# Patient Record
Sex: Female | Born: 1985 | Race: Black or African American | Hispanic: No | Marital: Single | Smoking: Former smoker
Health system: Southern US, Community
[De-identification: ages and names within clinical notes are randomized; demographics above are authoritative.]

## PROBLEM LIST (undated history)

## (undated) ENCOUNTER — Inpatient Hospital Stay (HOSPITAL_COMMUNITY): Payer: Self-pay

## (undated) DIAGNOSIS — Z789 Other specified health status: Secondary | ICD-10-CM

## (undated) HISTORY — PX: NO PAST SURGERIES: SHX2092

---

## 2007-11-16 ENCOUNTER — Emergency Department (HOSPITAL_COMMUNITY): Admission: EM | Admit: 2007-11-16 | Discharge: 2007-11-16 | Payer: Self-pay | Admitting: Emergency Medicine

## 2008-12-29 ENCOUNTER — Emergency Department (HOSPITAL_BASED_OUTPATIENT_CLINIC_OR_DEPARTMENT_OTHER): Admission: EM | Admit: 2008-12-29 | Discharge: 2008-12-29 | Payer: Self-pay | Admitting: Emergency Medicine

## 2009-07-23 ENCOUNTER — Emergency Department (HOSPITAL_BASED_OUTPATIENT_CLINIC_OR_DEPARTMENT_OTHER): Admission: EM | Admit: 2009-07-23 | Discharge: 2009-07-23 | Payer: Self-pay | Admitting: Emergency Medicine

## 2009-07-24 ENCOUNTER — Emergency Department (HOSPITAL_BASED_OUTPATIENT_CLINIC_OR_DEPARTMENT_OTHER): Admission: EM | Admit: 2009-07-24 | Discharge: 2009-07-24 | Payer: Self-pay | Admitting: Emergency Medicine

## 2009-07-27 ENCOUNTER — Ambulatory Visit: Payer: Self-pay | Admitting: Radiology

## 2009-07-27 ENCOUNTER — Ambulatory Visit (HOSPITAL_BASED_OUTPATIENT_CLINIC_OR_DEPARTMENT_OTHER): Admission: RE | Admit: 2009-07-27 | Discharge: 2009-07-27 | Payer: Self-pay | Admitting: Emergency Medicine

## 2010-02-11 ENCOUNTER — Emergency Department (HOSPITAL_BASED_OUTPATIENT_CLINIC_OR_DEPARTMENT_OTHER)
Admission: EM | Admit: 2010-02-11 | Discharge: 2010-02-11 | Payer: Self-pay | Source: Home / Self Care | Admitting: Emergency Medicine

## 2010-09-26 LAB — CBC
Hemoglobin: 11.9 g/dL — ABNORMAL LOW (ref 12.0–15.0)
MCHC: 32.3 g/dL (ref 30.0–36.0)
MCV: 79.1 fL (ref 78.0–100.0)
Platelets: 260 10*3/uL (ref 150–400)
RBC: 4.64 MIL/uL (ref 3.87–5.11)

## 2010-09-26 LAB — URINE MICROSCOPIC-ADD ON

## 2010-09-26 LAB — HEPATIC FUNCTION PANEL
Bilirubin, Direct: 0 mg/dL (ref 0.0–0.3)
Indirect Bilirubin: 0.4 mg/dL (ref 0.3–0.9)

## 2010-09-26 LAB — DIFFERENTIAL
Basophils Absolute: 0 10*3/uL (ref 0.0–0.1)
Basophils Relative: 1 % (ref 0–1)
Eosinophils Relative: 2 % (ref 0–5)
Lymphs Abs: 1.7 10*3/uL (ref 0.7–4.0)
Neutrophils Relative %: 34 % — ABNORMAL LOW (ref 43–77)

## 2010-09-26 LAB — BASIC METABOLIC PANEL
BUN: 8 mg/dL (ref 6–23)
CO2: 30 mEq/L (ref 19–32)
Potassium: 3.8 mEq/L (ref 3.5–5.1)
Sodium: 142 mEq/L (ref 135–145)

## 2010-09-26 LAB — WET PREP, GENITAL
Trich, Wet Prep: NONE SEEN
Yeast Wet Prep HPF POC: NONE SEEN

## 2010-09-26 LAB — URINALYSIS, ROUTINE W REFLEX MICROSCOPIC
Leukocytes, UA: NEGATIVE
Nitrite: NEGATIVE
Specific Gravity, Urine: 1.025 (ref 1.005–1.030)
Urobilinogen, UA: 1 mg/dL (ref 0.0–1.0)
pH: 8.5 — ABNORMAL HIGH (ref 5.0–8.0)

## 2010-09-26 LAB — LIPASE, BLOOD: Lipase: 47 U/L (ref 23–300)

## 2010-10-12 ENCOUNTER — Emergency Department (HOSPITAL_BASED_OUTPATIENT_CLINIC_OR_DEPARTMENT_OTHER)
Admission: EM | Admit: 2010-10-12 | Discharge: 2010-10-12 | Disposition: A | Discharge status: Home / Self Care | Payer: BC Managed Care – PPO | Attending: Emergency Medicine | Admitting: Emergency Medicine

## 2010-10-12 ENCOUNTER — Emergency Department (INDEPENDENT_AMBULATORY_CARE_PROVIDER_SITE_OTHER): Payer: BC Managed Care – PPO

## 2010-10-12 DIAGNOSIS — R079 Chest pain, unspecified: Secondary | ICD-10-CM

## 2010-10-12 DIAGNOSIS — F17200 Nicotine dependence, unspecified, uncomplicated: Secondary | ICD-10-CM

## 2010-10-12 DIAGNOSIS — K920 Hematemesis: Secondary | ICD-10-CM

## 2010-10-12 DIAGNOSIS — R112 Nausea with vomiting, unspecified: Secondary | ICD-10-CM | POA: Insufficient documentation

## 2010-10-12 LAB — DIFFERENTIAL
Lymphocytes Relative: 55 % — ABNORMAL HIGH (ref 12–46)
Monocytes Absolute: 0.4 10*3/uL (ref 0.1–1.0)
Monocytes Relative: 8 % (ref 3–12)
Neutro Abs: 1.9 10*3/uL (ref 1.7–7.7)
Neutrophils Relative %: 36 % — ABNORMAL LOW (ref 43–77)

## 2010-10-12 LAB — URINALYSIS, ROUTINE W REFLEX MICROSCOPIC
Glucose, UA: NEGATIVE mg/dL
Hgb urine dipstick: NEGATIVE
Ketones, ur: NEGATIVE mg/dL
Protein, ur: NEGATIVE mg/dL
Specific Gravity, Urine: 1.018 (ref 1.005–1.030)
pH: 8 (ref 5.0–8.0)

## 2010-10-12 LAB — CBC
MCH: 24.2 pg — ABNORMAL LOW (ref 26.0–34.0)
MCHC: 33.7 g/dL (ref 30.0–36.0)
RDW: 12 % (ref 11.5–15.5)
WBC: 5.2 10*3/uL (ref 4.0–10.5)

## 2010-10-12 LAB — POCT CARDIAC MARKERS: CKMB, poc: <1 ng/mL — ABNORMAL LOW (ref 1.0–8.0)

## 2010-10-12 LAB — COMPREHENSIVE METABOLIC PANEL
ALT: 8 U/L (ref 0–35)
BUN: 10 mg/dL (ref 6–23)
Calcium: 9.1 mg/dL (ref 8.4–10.5)
Glucose, Bld: 72 mg/dL (ref 70–99)
Total Protein: 7.7 g/dL (ref 6.0–8.3)

## 2011-01-24 ENCOUNTER — Inpatient Hospital Stay (HOSPITAL_COMMUNITY)
Admission: AD | Admit: 2011-01-24 | Discharge: 2011-01-24 | Disposition: A | Discharge status: Home / Self Care | Payer: BC Managed Care – PPO | Source: Ambulatory Visit | Attending: Obstetrics & Gynecology | Admitting: Obstetrics & Gynecology

## 2011-01-24 ENCOUNTER — Encounter (HOSPITAL_COMMUNITY): Payer: Self-pay

## 2011-01-24 DIAGNOSIS — B9789 Other viral agents as the cause of diseases classified elsewhere: Secondary | ICD-10-CM | POA: Insufficient documentation

## 2011-01-24 DIAGNOSIS — B349 Viral infection, unspecified: Secondary | ICD-10-CM

## 2011-01-24 DIAGNOSIS — R110 Nausea: Secondary | ICD-10-CM | POA: Insufficient documentation

## 2011-01-24 HISTORY — DX: Other specified health status: Z78.9

## 2011-01-24 LAB — URINALYSIS, ROUTINE W REFLEX MICROSCOPIC
Bilirubin Urine: NEGATIVE
Glucose, UA: NEGATIVE mg/dL
Ketones, ur: NEGATIVE mg/dL
Protein, ur: NEGATIVE mg/dL
pH: 7.5 (ref 5.0–8.0)

## 2011-01-24 LAB — POCT PREGNANCY, URINE: Preg Test, Ur: NEGATIVE

## 2011-01-24 MED ORDER — PROMETHAZINE HCL 25 MG PO TABS: 25.0000 mg | ORAL_TABLET | Freq: Four times a day (QID) | ORAL | Status: AC | PRN

## 2011-01-24 MED ORDER — PROMETHAZINE HCL 25 MG PO TABS: 25.0000 mg | ORAL_TABLET | Freq: Four times a day (QID) | ORAL | Status: DC | PRN

## 2011-01-24 NOTE — Progress Notes (Signed)
Pt states exposed to "hand foot mouth" disease by godchild last Tuesday. Noticed Sores on bilateral hands & feet on Saturday. States sores are painful & feel "tingly". Had fever Wednesday night. N/v since Wednesday, states hasn't been able to eat since Wednesday.

## 2011-01-24 NOTE — Progress Notes (Signed)
Pt states that she was exposed to hand, foot and mouth disease last week. Ran a fever last Wednesday,, and has now become nauseated, unable to eat and has small blisters on hands and one on her foot.

## 2011-01-24 NOTE — ED Provider Notes (Signed)
History    patient is a 25 year old black female who presents today complaining of nausea and blisters on her hands and feet. She states she was exposed to hand foot and mouth disease about 2 weeks ago. She is concerned that she may be infected. She states that she has been nauseated. She also complains of upper abdominal pain and she feels like she has to vomit. She denies lower abdominal pain, vaginal discharge, vaginal bleeding, or any other symptoms at this time. She did notice 2 small red bumps on her hands and one small red bump on her left foot. She denies itching. She denies fever or ulcerations to her mouth or oral mucosa.  Chief Complaint  Patient presents with  . Nausea   HPI  OB History    Grav Para Term Preterm Abortions TAB SAB Ect Mult Living   0               Past Medical History  Diagnosis Date  . No pertinent past medical history     Past Surgical History  Procedure Date  . No past surgeries     Family History  Problem Relation Age of Onset  . Diabetes Father   . Stroke Father     History  Substance Use Topics  . Smoking status: Current Some Day Smoker -- 0.2 packs/day for 5 years  . Smokeless tobacco: Never Used  . Alcohol Use: Yes     socally     Allergies:  Allergies  Allergen Reactions  . Codeine Hives and Other (See Comments)    Face swelling    Prescriptions prior to admission  Medication Sig Dispense Refill  . DM-Doxylamine-Acetaminophen (NYQUIL COLD & FLU PO) Take 30 mLs by mouth every 4 (four) hours as needed. For cold       . Phenylephrine-Pheniramine-DM (THERAFLU COLD & COUGH PO) Take 1 packet by mouth every 4 (four) hours as needed. For cold         Review of Systems  Constitutional: Positive for malaise/fatigue. Negative for fever and chills.  Eyes: Negative for blurred vision.  Respiratory: Negative for cough, sputum production, shortness of breath and wheezing.   Cardiovascular: Negative for chest pain and palpitations.    Gastrointestinal: Positive for nausea and abdominal pain. Negative for vomiting, diarrhea and constipation.  Genitourinary: Negative for dysuria, urgency, frequency, hematuria and flank pain.  Musculoskeletal: Negative for myalgias.  Skin: Positive for rash. Negative for itching.  Neurological: Negative for dizziness and headaches.  Psychiatric/Behavioral: Negative for depression and suicidal ideas.   Physical Exam   Blood pressure 113/70, pulse 88, temperature 98.4 F (36.9 C), temperature source Oral, resp. rate 16, height 5\' 3"  (1.6 m), weight 125 lb 3.2 oz (56.79 kg), last menstrual period 01/05/2011, SpO2 98.00%.  Physical Exam  Constitutional: She is oriented to person, place, and time. She appears well-developed and well-nourished. No distress.  HENT:  Head: Normocephalic and atraumatic.  Eyes: EOM are normal. Pupils are equal, round, and reactive to light.  Cardiovascular: Normal rate and regular rhythm.  Exam reveals no gallop and no friction rub.   No murmur heard. Respiratory: Effort normal and breath sounds normal. No respiratory distress. She has no wheezes. She has no rales. She exhibits no tenderness.  GI: Soft. She exhibits no distension. There is no tenderness. There is no rebound and no guarding.  Neurological: She is alert and oriented to person, place, and time.  Skin: Skin is warm and dry. She is not diaphoretic.  There were 2 small erythematous lesions on the left hand. There is also one small erythematous lesion to the left foot. No ulceration was noted. No tracking was noted.    MAU Course  Procedures  Assessment and plan: 1) viral syndrome: Patient most likely is experiencing a viral syndrome of some sort. It is unlikely that she is having hand foot and mouth disease is a factor she has no oral involvement. At this time I will give her a prescription for Phenergan to use when necessary. She will followup with her primary care provider. I did discuss with  her appropriate diet, activity, wrist, and precautions. She understood and agreed.   Henrietta Hoover, [place] 01/24/11 1021

## 2011-05-29 ENCOUNTER — Emergency Department (HOSPITAL_COMMUNITY)
Admission: EM | Admit: 2011-05-29 | Discharge: 2011-05-29 | Disposition: A | Discharge status: Home / Self Care | Payer: BC Managed Care – PPO | Attending: Emergency Medicine | Admitting: Emergency Medicine

## 2011-05-29 ENCOUNTER — Emergency Department (HOSPITAL_COMMUNITY)
Admission: EM | Admit: 2011-05-29 | Discharge: 2011-05-29 | Disposition: A | Discharge status: Home / Self Care | Payer: BC Managed Care – PPO | Source: Home / Self Care

## 2011-05-29 ENCOUNTER — Encounter (HOSPITAL_COMMUNITY): Payer: Self-pay | Admitting: *Deleted

## 2011-05-29 DIAGNOSIS — E876 Hypokalemia: Secondary | ICD-10-CM | POA: Insufficient documentation

## 2011-05-29 DIAGNOSIS — R1084 Generalized abdominal pain: Secondary | ICD-10-CM | POA: Insufficient documentation

## 2011-05-29 DIAGNOSIS — D649 Anemia, unspecified: Secondary | ICD-10-CM | POA: Insufficient documentation

## 2011-05-29 DIAGNOSIS — R630 Anorexia: Secondary | ICD-10-CM | POA: Insufficient documentation

## 2011-05-29 DIAGNOSIS — R112 Nausea with vomiting, unspecified: Secondary | ICD-10-CM | POA: Insufficient documentation

## 2011-05-29 LAB — BASIC METABOLIC PANEL
BUN: 8 mg/dL (ref 6–23)
CO2: 21 mEq/L (ref 19–32)
Calcium: 9.6 mg/dL (ref 8.4–10.5)
Chloride: 103 mEq/L (ref 96–112)
Creatinine, Ser: 0.67 mg/dL (ref 0.50–1.10)
Glucose, Bld: 100 mg/dL — ABNORMAL HIGH (ref 70–99)

## 2011-05-29 LAB — URINALYSIS, ROUTINE W REFLEX MICROSCOPIC
Bilirubin Urine: NEGATIVE
Hgb urine dipstick: NEGATIVE
Ketones, ur: NEGATIVE mg/dL
Specific Gravity, Urine: 1.026 (ref 1.005–1.030)
pH: 8 (ref 5.0–8.0)

## 2011-05-29 LAB — URINE MICROSCOPIC-ADD ON

## 2011-05-29 LAB — CBC
HCT: 34.7 % — ABNORMAL LOW (ref 36.0–46.0)
MCH: 23 pg — ABNORMAL LOW (ref 26.0–34.0)
MCV: 74 fL — ABNORMAL LOW (ref 78.0–100.0)
RBC: 4.69 MIL/uL (ref 3.87–5.11)
WBC: 5.6 10*3/uL (ref 4.0–10.5)

## 2011-05-29 MED ORDER — SODIUM CHLORIDE 0.9 % IV BOLUS (SEPSIS): 1000.0000 mL | Freq: Once | INTRAVENOUS | Status: DC

## 2011-05-29 MED ORDER — MORPHINE SULFATE 4 MG/ML IJ SOLN
4.0000 mg | Freq: Once | INTRAMUSCULAR | Status: AC
  Administered 2011-05-29: 4 mg via INTRAVENOUS
  Filled 2011-05-29: qty 1

## 2011-05-29 MED ORDER — ONDANSETRON HCL 4 MG/2ML IJ SOLN
4.0000 mg | Freq: Once | INTRAMUSCULAR | Status: AC
  Administered 2011-05-29: 4 mg via INTRAVENOUS
  Filled 2011-05-29: qty 2

## 2011-05-29 MED ORDER — POTASSIUM CHLORIDE CRYS ER 20 MEQ PO TBCR
EXTENDED_RELEASE_TABLET | ORAL | Status: AC
  Administered 2011-05-29: 22:00:00
  Filled 2011-05-29: qty 2

## 2011-05-29 MED ORDER — SODIUM CHLORIDE 0.9 % IV BOLUS (SEPSIS)
1000.0000 mL | Freq: Once | INTRAVENOUS | Status: AC
  Administered 2011-05-29: 1000 mL via INTRAVENOUS

## 2011-05-29 MED ORDER — POTASSIUM CHLORIDE 20 MEQ/15ML (10%) PO LIQD: 40.0000 meq | Freq: Once | ORAL | Status: DC

## 2011-05-29 MED ORDER — ONDANSETRON HCL 4 MG/2ML IJ SOLN: 4.0000 mg | Freq: Four times a day (QID) | INTRAMUSCULAR | Status: DC | PRN

## 2011-05-29 MED ORDER — SODIUM CHLORIDE 0.9 % IV SOLN
Freq: Once | INTRAVENOUS | Status: AC
  Administered 2011-05-29: 19:00:00 via INTRAVENOUS

## 2011-05-29 MED ORDER — ONDANSETRON HCL 4 MG PO TABS: 4.0000 mg | ORAL_TABLET | Freq: Four times a day (QID) | ORAL | Status: AC

## 2011-05-29 NOTE — ED Notes (Signed)
Patient was at [facility] and left to come to [facility].  She stated that, " I didn't want to wait three hours to be seen so I left."

## 2011-05-29 NOTE — ED Notes (Signed)
Patient is resting comfortably. 

## 2011-05-29 NOTE — ED Notes (Signed)
Pt NAD, AOx4, resp e/u. Pt states understanding of discharge instructions and denies questions at this time. Pt ambulatory with steady gait.

## 2011-05-29 NOTE — ED Provider Notes (Signed)
Patient is in CDU under observation, dehydration, protocol.  Patient states she is feeling much better now.  Denies current nausea.  Labs resulted showing mild hypokalemia and anemia.  Patient states that she has history of anemia.  Potassium was given to patient.  I discussed all results with patient.  Plan is for discharge home with Zofran.  I have advised patient to return for worsening symptoms and to use the resources for followup with her primary care provider.  Dillard Cannon [place], [place] 05/30/11 [phone]

## 2011-05-29 NOTE — ED Provider Notes (Signed)
History     CSN: [id number] Arrival date & time: 05/29/2011  4:07 PM   First MD Initiated Contact with Patient 05/29/11 1735      Chief Complaint  Patient presents with  . Emesis  . Abdominal Pain    (Consider location/radiation/quality/duration/timing/severity/associated sxs/prior treatment) The history is provided by the patient.   patient reports nausea vomiting and generalized abdominal pain since 6 AM.  She's had nonbloody nonbilious vomiting.  She denies diarrhea.  She denies dysuria and vaginal discharge.  She denies urinary frequency.  Reports his been unable to keep anything down.  She denies fever or chills.  She denies flank pain.  No chest pain shortness of breath.  Denies cough.  Her symptoms are worsened by nothing.  They're approved by nothing.  Her symptoms are constant.  Her symptoms are severe  Past Medical History  Diagnosis Date  . No pertinent past medical history     Past Surgical History  Procedure Date  . No past surgeries     Family History  Problem Relation Age of Onset  . Diabetes Father   . Stroke Father     History  Substance Use Topics  . Smoking status: Current Some Day Smoker -- 0.2 packs/day for 5 years  . Smokeless tobacco: Never Used  . Alcohol Use: Yes     socally     OB History    Grav Para Term Preterm Abortions TAB SAB Ect Mult Living   0               Review of Systems  Gastrointestinal: Positive for vomiting and abdominal pain.  All other systems reviewed and are negative.    Allergies  Codeine  Home Medications  No current outpatient prescriptions on file.  BP 100/62  Pulse 84  Temp(Src) 98.4 F (36.9 C) (Oral)  Resp 22  SpO2 100%  LMP 04/11/2011  Physical Exam  Nursing note and vitals reviewed. Constitutional: She is oriented to person, place, and time. She appears well-developed and well-nourished. No distress.  HENT:  Head: Normocephalic and atraumatic.       Dry mucous membranes  Eyes: EOM are  normal.  Neck: Normal range of motion.  Cardiovascular: Normal rate, regular rhythm and normal heart sounds.   Pulmonary/Chest: Effort normal and breath sounds normal.  Abdominal: Soft. She exhibits no distension. There is no tenderness.  Musculoskeletal: Normal range of motion.  Neurological: She is alert and oriented to person, place, and time.  Skin: Skin is warm and dry.  Psychiatric: She has a normal mood and affect. Judgment normal.    ED Course  Procedures (including critical care time)  Labs Reviewed  URINALYSIS, ROUTINE W REFLEX MICROSCOPIC - Abnormal; Notable for the following:    Protein, ur 30 (*)    All other components within normal limits  CBC - Abnormal; Notable for the following:    Hemoglobin 10.8 (*)    HCT 34.7 (*)    MCV 74.0 (*)    MCH 23.0 (*)    All other components within normal limits  BASIC METABOLIC PANEL - Abnormal; Notable for the following:    Potassium 3.1 (*)    Glucose, Bld 100 (*)    All other components within normal limits  URINE MICROSCOPIC-ADD ON - Abnormal; Notable for the following:    Squamous Epithelial / LPF FEW (*)    Bacteria, UA FEW (*)    All other components within normal limits  PREGNANCY, URINE  No results found.   1. Nausea and vomiting   2. Hypokalemia   3. Anemia       MDM  Nausea vomiting without diarrhea.  Her abdominal exam is benign.  Suspect viral etiology.  Still awaiting urine and urine pregnancy at this time.  My suspicion for ectopic his lobe however will require a negative urine pregnancy test before she is discharged home.  She feels much better after IV fluids and antibiotics.  She is currently on the dehydration protocol while we evaluate her in the emergency department.  Care will be be continued by my physician assistant Ms. Trixie Dredge        Lyanne Co, MD 05/29/11 2255

## 2011-05-29 NOTE — ED Notes (Signed)
Patient denies pain and is resting comfortably.  

## 2011-05-29 NOTE — ED Notes (Signed)
Reports n/v and abd pain since this am 0600. Denies diarrhea.

## 2011-05-29 NOTE — ED Notes (Signed)
Pt not able to urinate yet. Pt has received l liter bolus of saline iv.

## 2011-05-29 NOTE — ED Notes (Signed)
States going out and having etoh drinks last pm. States awakening at 6am with sx. Pt also states left quadrant abd pain.

## 2011-05-30 NOTE — ED Provider Notes (Addendum)
Medical screening examination/treatment/procedure(s) were conducted as a shared visit with non-physician practitioner(s) and myself.  I personally evaluated the patient during the encounter  I was the primary provider of this patient during this ER visit. The patients care was continued in the CDU and managed in conjunction with my midlevel providers   1. Nausea and vomiting   2. Hypokalemia   3. Anemia      Results for orders placed during the hospital encounter of 05/29/11  URINALYSIS, ROUTINE W REFLEX MICROSCOPIC      Component Value Range   Color, Urine YELLOW  YELLOW    Appearance CLEAR  CLEAR    Specific Gravity, Urine 1.026  1.005 - 1.030    pH 8.0  5.0 - 8.0    Glucose, UA NEGATIVE  NEGATIVE (mg/dL)   Hgb urine dipstick NEGATIVE  NEGATIVE    Bilirubin Urine NEGATIVE  NEGATIVE    Ketones, ur NEGATIVE  NEGATIVE (mg/dL)   Protein, ur 30 (*) NEGATIVE (mg/dL)   Urobilinogen, UA 0.2  0.0 - 1.0 (mg/dL)   Nitrite NEGATIVE  NEGATIVE    Leukocytes, UA NEGATIVE  NEGATIVE   PREGNANCY, URINE      Component Value Range   Preg Test, Ur NEGATIVE    CBC      Component Value Range   WBC 5.6  4.0 - 10.5 (K/uL)   RBC 4.69  3.87 - 5.11 (MIL/uL)   Hemoglobin 10.8 (*) 12.0 - 15.0 (g/dL)   HCT 16.1 (*) 09.6 - 46.0 (%)   MCV 74.0 (*) 78.0 - 100.0 (fL)   MCH 23.0 (*) 26.0 - 34.0 (pg)   MCHC 31.1  30.0 - 36.0 (g/dL)   RDW 04.5  40.9 - 81.1 (%)   Platelets 304  150 - 400 (K/uL)  BASIC METABOLIC PANEL      Component Value Range   Sodium 140  135 - 145 (mEq/L)   Potassium 3.1 (*) 3.5 - 5.1 (mEq/L)   Chloride 103  96 - 112 (mEq/L)   CO2 21  19 - 32 (mEq/L)   Glucose, Bld 100 (*) 70 - 99 (mg/dL)   BUN 8  6 - 23 (mg/dL)   Creatinine, Ser 9.14  0.50 - 1.10 (mg/dL)   Calcium 9.6  8.4 - 78.2 (mg/dL)   GFR calc non Af Amer >90  >90 (mL/min)   GFR calc Af Amer >90  >90 (mL/min)  URINE MICROSCOPIC-ADD ON      Component Value Range   Squamous Epithelial / LPF FEW (*) RARE    WBC, UA 0-2  <3  (WBC/hpf)   RBC / HPF 0-2  <3 (RBC/hpf)   Bacteria, UA FEW (*) RARE    Urine-Other MUCOUS PRESENT      Lyanne Co, MD 05/30/11 9562  Lyanne Co, MD 05/30/11 [phone]

## 2016-03-13 ENCOUNTER — Encounter (HOSPITAL_BASED_OUTPATIENT_CLINIC_OR_DEPARTMENT_OTHER): Payer: Self-pay | Admitting: Emergency Medicine

## 2016-03-13 ENCOUNTER — Inpatient Hospital Stay (HOSPITAL_BASED_OUTPATIENT_CLINIC_OR_DEPARTMENT_OTHER)
Admission: EM | Admit: 2016-03-13 | Discharge: 2016-03-13 | Disposition: A | Discharge status: Home / Self Care | Payer: Self-pay | Attending: Obstetrics & Gynecology | Admitting: Obstetrics & Gynecology

## 2016-03-13 ENCOUNTER — Emergency Department (HOSPITAL_BASED_OUTPATIENT_CLINIC_OR_DEPARTMENT_OTHER): Payer: Self-pay

## 2016-03-13 DIAGNOSIS — O9989 Other specified diseases and conditions complicating pregnancy, childbirth and the puerperium: Secondary | ICD-10-CM | POA: Insufficient documentation

## 2016-03-13 DIAGNOSIS — O4700 False labor before 37 completed weeks of gestation, unspecified trimester: Secondary | ICD-10-CM

## 2016-03-13 DIAGNOSIS — O4703 False labor before 37 completed weeks of gestation, third trimester: Secondary | ICD-10-CM | POA: Insufficient documentation

## 2016-03-13 DIAGNOSIS — O479 False labor, unspecified: Secondary | ICD-10-CM

## 2016-03-13 DIAGNOSIS — M7989 Other specified soft tissue disorders: Secondary | ICD-10-CM | POA: Insufficient documentation

## 2016-03-13 DIAGNOSIS — Z3A32 32 weeks gestation of pregnancy: Secondary | ICD-10-CM | POA: Insufficient documentation

## 2016-03-13 DIAGNOSIS — O99013 Anemia complicating pregnancy, third trimester: Secondary | ICD-10-CM | POA: Insufficient documentation

## 2016-03-13 DIAGNOSIS — Z87891 Personal history of nicotine dependence: Secondary | ICD-10-CM | POA: Insufficient documentation

## 2016-03-13 HISTORY — DX: Other specified health status: Z78.9

## 2016-03-13 LAB — CBC WITH DIFFERENTIAL/PLATELET
BASOS PCT: 0 %
Basophils Absolute: 0 10*3/uL (ref 0.0–0.1)
EOS ABS: 0.1 10*3/uL (ref 0.0–0.7)
Eosinophils Relative: 1 %
HCT: 29.6 % — ABNORMAL LOW (ref 36.0–46.0)
Hemoglobin: 9.8 g/dL — ABNORMAL LOW (ref 12.0–15.0)
Lymphocytes Relative: 29 %
Lymphs Abs: 2 10*3/uL (ref 0.7–4.0)
MCH: 26.1 pg (ref 26.0–34.0)
MCHC: 33.1 g/dL (ref 30.0–36.0)
MCV: 78.7 fL (ref 78.0–100.0)
MONO ABS: 0.8 10*3/uL (ref 0.1–1.0)
MONOS PCT: 11 %
Neutro Abs: 4 10*3/uL (ref 1.7–7.7)
Neutrophils Relative %: 59 %
PLATELETS: 306 10*3/uL (ref 150–400)
RBC: 3.76 MIL/uL — ABNORMAL LOW (ref 3.87–5.11)
RDW: 13.4 % (ref 11.5–15.5)
WBC: 6.8 10*3/uL (ref 4.0–10.5)

## 2016-03-13 LAB — URINALYSIS, ROUTINE W REFLEX MICROSCOPIC
BILIRUBIN URINE: NEGATIVE
Glucose, UA: NEGATIVE mg/dL
Hgb urine dipstick: NEGATIVE
KETONES UR: NEGATIVE mg/dL
Leukocytes, UA: NEGATIVE
NITRITE: NEGATIVE
PH: 8 (ref 5.0–8.0)
PROTEIN: NEGATIVE mg/dL
Specific Gravity, Urine: 1.014 (ref 1.005–1.030)

## 2016-03-13 LAB — COMPREHENSIVE METABOLIC PANEL
ALBUMIN: 3.1 g/dL — AB (ref 3.5–5.0)
ALK PHOS: 70 U/L (ref 38–126)
ALT: 20 U/L (ref 14–54)
AST: 32 U/L (ref 15–41)
Anion gap: 8 (ref 5–15)
BILIRUBIN TOTAL: 0.4 mg/dL (ref 0.3–1.2)
BUN: <5 mg/dL — ABNORMAL LOW (ref 6–20)
CALCIUM: 8.7 mg/dL — AB (ref 8.9–10.3)
CO2: 22 mmol/L (ref 22–32)
CREATININE: 0.55 mg/dL (ref 0.44–1.00)
Chloride: 104 mmol/L (ref 101–111)
GFR calc non Af Amer: >60 mL/min (ref >60)
GLUCOSE: 78 mg/dL (ref 65–99)
Potassium: 3.5 mmol/L (ref 3.5–5.1)
SODIUM: 134 mmol/L — AB (ref 135–145)
TOTAL PROTEIN: 6.2 g/dL — AB (ref 6.5–8.1)

## 2016-03-13 LAB — BRAIN NATRIURETIC PEPTIDE: B NATRIURETIC PEPTIDE 5: 23 pg/mL (ref 0.0–100.0)

## 2016-03-13 MED ORDER — LACTATED RINGERS IV BOLUS (SEPSIS)
1000.0000 mL | Freq: Once | INTRAVENOUS | Status: AC
  Administered 2016-03-13: 1000 mL via INTRAVENOUS

## 2016-03-13 NOTE — MAU Provider Note (Signed)
History   [id number]   Chief Complaint  Patient presents with  . Leg Swelling    pregnant [redacted] weeks    HPI Amber Thornton is a 30 y.o. female  G1P0 at [redacted]w[redacted]d IUP here to rule-out preterm labor.  Pt previously seen at [facility] for leg swelling and DVT rule-out; imaging negative.  Sent to [facility] due to contractions showing on the monitor.  Pt denies feeling contractions.  No report of vaginal bleeding, leaking of fluid, or UTI symptoms.  +fetal movement.  Pt denies problems during this pregnancy.  Receives prenatal care in [place].     No LMP recorded (exact date). Patient is pregnant.  OB History  Gravida Para Term Preterm AB Living  1            SAB TAB Ectopic Multiple Live Births               # Outcome Date GA Lbr Len/2nd Weight Sex Delivery Anes PTL Lv  1 Current               Past Medical History:  Diagnosis Date  . Medical history non-contributory   . No pertinent past medical history     Family History  Problem Relation Age of Onset  . Diabetes Father   . Stroke Father     Social History   Social History  . Marital status: Single    Spouse name: N/A  . Number of children: N/A  . Years of education: N/A   Social History Main Topics  . Smoking status: Former Smoker    Years: 0.00  . Smokeless tobacco: Never Used  . Alcohol use No  . Drug use: No  . Sexual activity: No   Other Topics Concern  . None   Social History Narrative  . None    Allergies  Allergen Reactions  . Codeine Hives and Other (See Comments)    Face swelling    No current facility-administered medications on file prior to encounter.    No current outpatient prescriptions on file prior to encounter.     Review of Systems  Pertinent information in HPI  Physical Exam   Vitals:   03/13/16 1755 03/13/16 1827 03/13/16 1851 03/13/16 1953  BP: 105/59 122/68 125/68 108/66  Pulse: 88 88 88 85  Resp: 18 18 18 20   Temp:  98.1 F (36.7 C)  97.6 F (36.4 C)   TempSrc:    Oral  SpO2: 100% 100% 100% 100%  Weight:      Height:        Physical Exam  Constitutional: She is oriented to person, place, and time. She appears well-developed and well-nourished.  HENT:  Head: Normocephalic.  Neck: Normal range of motion. Neck supple.  Cardiovascular: Normal rate, regular rhythm and normal heart sounds.   Respiratory: Effort normal and breath sounds normal. No respiratory distress.  GI: Soft. There is no tenderness.  Genitourinary: No bleeding in the vagina. Vaginal discharge (mucusy) found.  Musculoskeletal: Normal range of motion. She exhibits no edema.  Neurological: She is alert and oriented to person, place, and time.  Skin: Skin is warm and dry.   Dilation: Closed Effacement (%): Thick Exam by:: Margarita Mail CNM  FHR 130's, +accels Toco - irregular (not felt by patient)  MAU Course  Procedures Results for orders placed or performed during the hospital encounter of 03/13/16 (from the past 24 hour(s))  Comprehensive metabolic panel     Status: Abnormal  Collection Time: 03/13/16  4:45 PM  Result Value Ref Range   Sodium 134 (L) 135 - 145 mmol/L   Potassium 3.5 3.5 - 5.1 mmol/L   Chloride 104 101 - 111 mmol/L   CO2 22 22 - 32 mmol/L   Glucose, Bld 78 65 - 99 mg/dL   BUN <5 (L) 6 - 20 mg/dL   Creatinine, Ser 9.620.55 0.44 - 1.00 mg/dL   Calcium 8.7 (L) 8.9 - 10.3 mg/dL   Total Protein 6.2 (L) 6.5 - 8.1 g/dL   Albumin 3.1 (L) 3.5 - 5.0 g/dL   AST 32 15 - 41 U/L   ALT 20 14 - 54 U/L   Alkaline Phosphatase 70 38 - 126 U/L   Total Bilirubin 0.4 0.3 - 1.2 mg/dL   GFR calc non Af Amer >60 >60 mL/min   GFR calc Af Amer >60 >60 mL/min   Anion gap 8 5 - 15  CBC with Differential     Status: Abnormal   Collection Time: 03/13/16  4:45 PM  Result Value Ref Range   WBC 6.8 4.0 - 10.5 K/uL   RBC 3.76 (L) 3.87 - 5.11 MIL/uL   Hemoglobin 9.8 (L) 12.0 - 15.0 g/dL   HCT 95.229.6 (L) 84.136.0 - 32.446.0 %   MCV 78.7 78.0 - 100.0 fL   MCH 26.1 26.0 - 34.0 pg    MCHC 33.1 30.0 - 36.0 g/dL   RDW 40.113.4 02.711.5 - 25.315.5 %   Platelets 306 150 - 400 K/uL   Neutrophils Relative % 59 %   Neutro Abs 4.0 1.7 - 7.7 K/uL   Lymphocytes Relative 29 %   Lymphs Abs 2.0 0.7 - 4.0 K/uL   Monocytes Relative 11 %   Monocytes Absolute 0.8 0.1 - 1.0 K/uL   Eosinophils Relative 1 %   Eosinophils Absolute 0.1 0.0 - 0.7 K/uL   Basophils Relative 0 %   Basophils Absolute 0.0 0.0 - 0.1 K/uL  Brain natriuretic peptide     Status: None   Collection Time: 03/13/16  4:45 PM  Result Value Ref Range   B Natriuretic Peptide 23.0 0.0 - 100.0 pg/mL  Urinalysis, Routine w reflex microscopic     Status: None   Collection Time: 03/13/16  5:15 PM  Result Value Ref Range   Color, Urine YELLOW YELLOW   APPearance CLEAR CLEAR   Specific Gravity, Urine 1.014 1.005 - 1.030   pH 8.0 5.0 - 8.0   Glucose, UA NEGATIVE NEGATIVE mg/dL   Hgb urine dipstick NEGATIVE NEGATIVE   Bilirubin Urine NEGATIVE NEGATIVE   Ketones, ur NEGATIVE NEGATIVE mg/dL   Protein, ur NEGATIVE NEGATIVE mg/dL   Nitrite NEGATIVE NEGATIVE   Leukocytes, UA NEGATIVE NEGATIVE     Assessment and Plan  30 y.o. G1P0 at 1662w2d IUP  Braxton Hick's - Normal Exam Reactive NST  Plan: Discharge home Reviewed kick counts Keep prenatal care appointment  Marlis EdelsonWalidah N Karim, CNM 03/13/2016 8:30 PM

## 2016-03-13 NOTE — Progress Notes (Signed)
Report given to Laurell Josephsheryl  Anderson RN.

## 2016-03-13 NOTE — ED Notes (Signed)
Report given to [facility]

## 2016-03-13 NOTE — ED Provider Notes (Signed)
[facility] Provider Note   CSN: [id number] Arrival date & time: 03/13/16  1549 By signing my name below, I, Bridgette Habermann, attest that this documentation has been prepared under the direction and in the presence of Pricilla Loveless, MD. Electronically Signed: Bridgette Habermann, ED Scribe. 03/13/16. 4:38 PM.  History   Chief Complaint Chief Complaint  Patient presents with  . Leg Swelling    pregnant [redacted] weeks   HPI Comments: Amber Thornton is a 30 y.o. female who is [redacted] weeks pregnant who presents to the Emergency Department complaining of gradual onset, constant, 7/10, aching left calf pain onset this morning. Pt has associated shortness of breath when she stands up. Pt notes she has been having bilateral lower extremity swelling (L>R) beginning a month ago. No alleviating factors noted. She states that she has been traveling back and forth to [place], the last time she flew was 2 days ago. Denies fever, cough, chest pain, or any other associated symptoms.  The history is provided by the patient. No language interpreter was used.    Past Medical History:  Diagnosis Date  . No pertinent past medical history     Patient Active Problem List   Diagnosis Date Noted  . Anemia 05/29/2011    Past Surgical History:  Procedure Laterality Date  . NO PAST SURGERIES      OB History    Gravida Para Term Preterm AB Living   1             SAB TAB Ectopic Multiple Live Births                   Home Medications    Prior to Admission medications   Not on File    Family History Family History  Problem Relation Age of Onset  . Diabetes Father   . Stroke Father     Social History Social History  Substance Use Topics  . Smoking status: Former Smoker    Years: 0.00  . Smokeless tobacco: Never Used  . Alcohol use No     Allergies   Codeine   Review of Systems Review of Systems  Constitutional: Negative for fever.  Respiratory: Positive for shortness of breath.     Cardiovascular: Positive for leg swelling. Negative for chest pain.  Musculoskeletal: Positive for arthralgias.  All other systems reviewed and are negative.    Physical Exam Updated Vital Signs BP 125/68   Pulse 88   Temp 98.1 F (36.7 C)   Resp 18   Ht 5\' 2"  (1.575 m)   Wt 156 lb (70.8 kg)   LMP  (Exact Date)   SpO2 100%   BMI 28.53 kg/m   Physical Exam  Constitutional: She is oriented to person, place, and time. She appears well-developed and well-nourished.  HENT:  Head: Normocephalic and atraumatic.  Right Ear: External ear normal.  Left Ear: External ear normal.  Nose: Nose normal.  Eyes: Right eye exhibits no discharge. Left eye exhibits no discharge.  Cardiovascular: Normal rate, regular rhythm and normal heart sounds.   Pulses:      Dorsalis pedis pulses are 2+ on the right side, and 2+ on the left side.  Pulmonary/Chest: Effort normal and breath sounds normal. She has no wheezes. She has no rales.  Abdominal: Soft. There is no tenderness.  Gravid  Musculoskeletal: She exhibits edema and tenderness.  Left leg symmetrically swollen, most prominently in the foot but also in the calf. No redness but there  is posterior calf tenderness. Mild right foot, non-pitting edema.  Neurological: She is alert and oriented to person, place, and time.  Skin: Skin is warm and dry.  Nursing note and vitals reviewed.    ED Treatments / Results  DIAGNOSTIC STUDIES: Oxygen Saturation is 100% on RA, normal by my interpretation.    COORDINATION OF CARE: 4:34 PM Discussed treatment plan with pt at bedside which includes ultrasound and pt agreed to plan.  Labs (all labs ordered are listed, but only abnormal results are displayed) Labs Reviewed  COMPREHENSIVE METABOLIC PANEL - Abnormal; Notable for the following:       Result Value   Sodium 134 (*)    BUN <5 (*)    Calcium 8.7 (*)    Total Protein 6.2 (*)    Albumin 3.1 (*)    All other components within normal limits  CBC  WITH DIFFERENTIAL/PLATELET - Abnormal; Notable for the following:    RBC 3.76 (*)    Hemoglobin 9.8 (*)    HCT 29.6 (*)    All other components within normal limits  URINALYSIS, ROUTINE W REFLEX MICROSCOPIC (NOT AT [facility])  BRAIN NATRIURETIC PEPTIDE    EKG  EKG Interpretation None       Radiology US Venous Img Lower Unilateral Left  Result Date: 03/13/2016 CLINICAL DATA:  Left lower extremity swelling and pain for 1 month. Patient is [redacted] weeks pregnant. EXAM: LEFT LOWER EXTREMITY VENOUS DOPPLER ULTRASOUND TECHNIQUE: Gray-scale sonography with graded compression, as well as color Doppler and duplex ultrasound were performed to evaluate the lower extremity deep venous systems from the level of the common femoral vein and including the common femoral, femoral, profunda femoral, popliteal and calf veins including the posterior tibial, peroneal and gastrocnemius veins when visible. The superficial great saphenous vein was also interrogated. Spectral Doppler was utilized to evaluate flow at rest and with distal augmentation maneuvers in the common femoral, femoral and popliteal veins. COMPARISON:  None. FINDINGS: Normal flow, compressibility, and augmentation within the distal common femoral, proximal profunda femoral, proximal greater saphenous, entire femoral, popliteal veins, and imaged calf veins. IMPRESSION: No evidence of left lower extremity deep venous thrombosis. Electronically Signed   By: Harmon Pier M.D.   On: 03/13/2016 17:23    Procedures Procedures (including critical care time)  Medications Ordered in ED Medications  lactated ringers bolus 1,000 mL (0 mLs Intravenous Stopped 03/13/16 1852)     Initial Impression / Assessment and Plan / ED Course  I have reviewed the triage vital signs and the nursing notes.  Pertinent labs & imaging results that were available during my care of the patient were reviewed by me and considered in my medical decision making (see chart for  details).  Clinical Course  Comment By Time  OB rapid response asks for IV fluid bolus given intermittent contractions. I am more worried about DVT than true fluid overload. Pricilla Loveless, MD 09/03 [phone]    Patient's workup including DVT ultrasound are unremarkable. However while being on the tocometry, she has been noted to have pretty consistent contractions. Patient is not feeling these. Fetal heart rate is doing well. Discussed with OB/GYN, Dr. Erin Fulling, recommends transfer to MAU for continues to, treatment and observation.  7:08 PM while waiting on [facility] to pick up patient, it is apparent that [facility] will be several hours. [facility] not comfortable with patient either. I have very low suspicion patient will actually deliver anytime soon and think these are likely not true contractions. After  discussion with patient she does not want to wait any longer on the ambulance and [facility]. She understands there is some risk of worsening baby outcome or worsening contractions or even delivery on driving. She is instructed to not eat or drink. She understands these risks and [facility].  Final Clinical Impressions(s) / ED Diagnoses   Final diagnoses:  Left leg swelling  Premature uterine contractions    New Prescriptions New Prescriptions   No medications on file   I personally performed the services described in this documentation, which was scribed in my presence. The recorded information has been reviewed and is accurate.    Pricilla LovelessScott Unika Nazareno, MD 03/13/16 1910

## 2016-03-13 NOTE — Progress Notes (Addendum)
Spoke with Dr. Erin FullingHarraway-Smith. Pt is a G1P0 at 32 2/[redacted] weeks gestation with c/o bilateral calf pain. Pt says she has been flying back and forth to [place]. Last time she flew was 2 days ago. FHR is a category 1 with ui and irregular uc's. [facility] RN has said that the pt is not complaining of any cramping or abd pain. No vaginal bleeding or leaking of fluid. Okay to give pt a liter of IVF. U/S of lower legs being done to r/o DVT.

## 2016-03-13 NOTE — ED Notes (Signed)
Pt on toco and fetal monitor, auto VS and also continuous pulse ox.

## 2016-03-13 NOTE — Progress Notes (Signed)
Spoke with Dr. Erin FullingHarraway-Smith. Pt is contracting every 4-6 min. Is receiving  IVF and has emptied her bladder. No vaginal bleeding or leaking of fluid. [facility] staff says pt denies feeling uc's. Pt [facility] for further evaluation for preterm U/C's. Labs are neg and pt has no DVT.

## 2016-03-13 NOTE — MAU Note (Signed)
Pt arrives and was [facility] and sent here via private vehicle for PTL assessment. Pt denies feeling ctxs. Pt denies LOF or vaginal bleeding. Fetus active.

## 2016-03-13 NOTE — ED Triage Notes (Signed)
Patient reports that she has been traveling back and forth to [place]. Last time she flew was 2 days ago

## 2016-03-13 NOTE — ED Notes (Signed)
[facility]

## 2016-03-13 NOTE — Progress Notes (Signed)
[facility]. Pt is a G2P0 at 32 2/[redacted] weeks gestation with c/o her feet and lower legs swelling and bilateral calf pain. Pt has been flying back and forth to [place]. Last time she flew was 2 days ago. Pt denies headache, blurred vision, spots before her eyes, or epigastric pain.Pt placed on EFM. MD has not yet seen pt.

## 2016-03-13 NOTE — ED Notes (Addendum)
MD at bedside to discuss transport delays with patient and discuss other  [facility] and states that she is able to transport herself by pov. Pt states that she [facility] and agrees with the plan.

## 2016-03-13 NOTE — ED Notes (Signed)
U/s at bedside

## 2016-03-13 NOTE — ED Notes (Signed)
MD with pt  

## 2016-03-13 NOTE — Progress Notes (Signed)
[facility] Ed notified that Dr. Erin FullingHarraway-Smith wants pt to be [facility] for further evaluation. Dr. Criss AlvineGoldston will call Dr. Erin FullingHarraway-Smith to arrange transfer.

## 2016-03-13 NOTE — Discharge Instructions (Signed)
Preterm Labor Information °Preterm labor is when labor starts at less than 37 weeks of pregnancy. The normal length of a pregnancy is 39 to 41 weeks. °CAUSES °Often, there is no identifiable underlying cause as to why a woman goes into preterm labor. One of the most common known causes of preterm labor is infection. Infections of the uterus, cervix, vagina, amniotic sac, bladder, kidney, or even the lungs (pneumonia) can cause labor to start. Other suspected causes of preterm labor include:  °· Urogenital infections, such as yeast infections and bacterial vaginosis.   °· Uterine abnormalities (uterine shape, uterine septum, fibroids, or bleeding from the placenta).   °· A cervix that has been operated on (it may fail to stay closed).   °· Malformations in the fetus.   °· Multiple gestations (twins, triplets, and so on).   °· Breakage of the amniotic sac.   °RISK FACTORS °· Having a previous history of preterm labor.   °· Having premature rupture of membranes (PROM).   °· Having a placenta that covers the opening of the cervix (placenta previa).   °· Having a placenta that separates from the uterus (placental abruption).   °· Having a cervix that is too weak to hold the fetus in the uterus (incompetent cervix).   °· Having too much fluid in the amniotic sac (polyhydramnios).   °· Taking illegal drugs or smoking while pregnant.   °· Not gaining enough weight while pregnant.   °· Being younger than 18 and older than 30 years old.   °· Having a low socioeconomic status.   °· Being African American. °SYMPTOMS °Signs and symptoms of preterm labor include:  °· Menstrual-like cramps, abdominal pain, or back pain. °· Uterine contractions that are regular, as frequent as six in an hour, regardless of their intensity (may be mild or painful). °· Contractions that start on the top of the uterus and spread down to the lower abdomen and back.   °· A sense of increased pelvic pressure.   °· A watery or bloody mucus discharge that  comes from the vagina.   °TREATMENT °Depending on the length of the pregnancy and other circumstances, your health care provider may suggest bed rest. If necessary, there are medicines that can be given to stop contractions and to mature the fetal lungs. If labor happens before 34 weeks of pregnancy, a prolonged hospital stay may be recommended. Treatment depends on the condition of both you and the fetus.  °WHAT SHOULD YOU DO IF YOU THINK YOU ARE IN PRETERM LABOR? °Call your health care provider right away. You will need to go to the hospital to get checked immediately. °HOW CAN YOU PREVENT PRETERM LABOR IN FUTURE PREGNANCIES? °You should:  °· Stop smoking if you smoke.  °· Maintain healthy weight gain and avoid chemicals and drugs that are not necessary. °· Be watchful for any type of infection. °· Inform your health care provider if you have a known history of preterm labor. °  °This information is not intended to replace advice given to you by your health care provider. Make sure you discuss any questions you have with your health care provider. °  °Document Released: 09/17/2003 Document Revised: 02/27/2013 Document Reviewed: 07/30/2012 °Elsevier Interactive Patient Education ©2016 [employer] °Fetal Movement Counts °Patient Name: __________________________________________________ Patient Due Date: ____________________ °Performing a fetal movement count is highly recommended in high-risk pregnancies, but it is good for every pregnant woman to do. Your health care provider may ask you to start counting fetal movements at 28 weeks of the pregnancy. Fetal movements often increase: °· After eating a full meal. °·   After physical activity. °· After eating or drinking something sweet or cold. °· At rest. °Pay attention to when you feel the baby is most active. This will help you notice a pattern of your baby's sleep and wake cycles and what factors contribute to an increase in fetal movement. It is important to  perform a fetal movement count at the same time each day when your baby is normally most active.  °HOW TO COUNT FETAL MOVEMENTS °1. Find a quiet and comfortable area to sit or lie down on your left side. Lying on your left side provides the best blood and oxygen circulation to your baby. °2. Write down the day and time on a sheet of paper or in a journal. °3. Start counting kicks, flutters, swishes, rolls, or jabs in a 2-hour period. You should feel at least 10 movements within 2 hours. °4. If you do not feel 10 movements in 2 hours, wait 2-3 hours and count again. Look for a change in the pattern or not enough counts in 2 hours. °SEEK MEDICAL CARE IF: °· You feel less than 10 counts in 2 hours, tried twice. °· There is no movement in over an hour. °· The pattern is changing or taking longer each day to reach 10 counts in 2 hours. °· You feel the baby is not moving as he or she usually does. °Date: ____________ Movements: ____________ Start time: ____________ Finish time: ____________  °Date: ____________ Movements: ____________ Start time: ____________ Finish time: ____________ °Date: ____________ Movements: ____________ Start time: ____________ Finish time: ____________ °Date: ____________ Movements: ____________ Start time: ____________ Finish time: ____________ °Date: ____________ Movements: ____________ Start time: ____________ Finish time: ____________ °Date: ____________ Movements: ____________ Start time: ____________ Finish time: ____________ °Date: ____________ Movements: ____________ Start time: ____________ Finish time: ____________ °Date: ____________ Movements: ____________ Start time: ____________ Finish time: ____________  °Date: ____________ Movements: ____________ Start time: ____________ Finish time: ____________ °Date: ____________ Movements: ____________ Start time: ____________ Finish time: ____________ °Date: ____________ Movements: ____________ Start time: ____________ Finish time:  ____________ °Date: ____________ Movements: ____________ Start time: ____________ Finish time: ____________ °Date: ____________ Movements: ____________ Start time: ____________ Finish time: ____________ °Date: ____________ Movements: ____________ Start time: ____________ Finish time: ____________ °Date: ____________ Movements: ____________ Start time: ____________ Finish time: ____________  °Date: ____________ Movements: ____________ Start time: ____________ Finish time: ____________ °Date: ____________ Movements: ____________ Start time: ____________ Finish time: ____________ °Date: ____________ Movements: ____________ Start time: ____________ Finish time: ____________ °Date: ____________ Movements: ____________ Start time: ____________ Finish time: ____________ °Date: ____________ Movements: ____________ Start time: ____________ Finish time: ____________ °Date: ____________ Movements: ____________ Start time: ____________ Finish time: ____________ °Date: ____________ Movements: ____________ Start time: ____________ Finish time: ____________  °Date: ____________ Movements: ____________ Start time: ____________ Finish time: ____________ °Date: ____________ Movements: ____________ Start time: ____________ Finish time: ____________ °Date: ____________ Movements: ____________ Start time: ____________ Finish time: ____________ °Date: ____________ Movements: ____________ Start time: ____________ Finish time: ____________ °Date: ____________ Movements: ____________ Start time: ____________ Finish time: ____________ °Date: ____________ Movements: ____________ Start time: ____________ Finish time: ____________ °Date: ____________ Movements: ____________ Start time: ____________ Finish time: ____________  °Date: ____________ Movements: ____________ Start time: ____________ Finish time: ____________ °Date: ____________ Movements: ____________ Start time: ____________ Finish time: ____________ °Date: ____________ Movements:  ____________ Start time: ____________ Finish time: ____________ °Date: ____________ Movements: ____________ Start time: ____________ Finish time: ____________ °Date: ____________ Movements: ____________ Start time: ____________ Finish time: ____________ °Date: ____________ Movements: ____________ Start time: ____________ Finish time: ____________ °Date: ____________ Movements: ____________ Start time: ____________   Finish time: ____________  °Date: ____________ Movements: ____________ Start time: ____________ Finish time: ____________ °Date: ____________ Movements: ____________ Start time: ____________ Finish time: ____________ °Date: ____________ Movements: ____________ Start time: ____________ Finish time: ____________ °Date: ____________ Movements: ____________ Start time: ____________ Finish time: ____________ °Date: ____________ Movements: ____________ Start time: ____________ Finish time: ____________ °Date: ____________ Movements: ____________ Start time: ____________ Finish time: ____________ °Date: ____________ Movements: ____________ Start time: ____________ Finish time: ____________  °Date: ____________ Movements: ____________ Start time: ____________ Finish time: ____________ °Date: ____________ Movements: ____________ Start time: ____________ Finish time: ____________ °Date: ____________ Movements: ____________ Start time: ____________ Finish time: ____________ °Date: ____________ Movements: ____________ Start time: ____________ Finish time: ____________ °Date: ____________ Movements: ____________ Start time: ____________ Finish time: ____________ °Date: ____________ Movements: ____________ Start time: ____________ Finish time: ____________ °Date: ____________ Movements: ____________ Start time: ____________ Finish time: ____________  °Date: ____________ Movements: ____________ Start time: ____________ Finish time: ____________ °Date: ____________ Movements: ____________ Start time: ____________ Finish  time: ____________ °Date: ____________ Movements: ____________ Start time: ____________ Finish time: ____________ °Date: ____________ Movements: ____________ Start time: ____________ Finish time: ____________ °Date: ____________ Movements: ____________ Start time: ____________ Finish time: ____________ °Date: ____________ Movements: ____________ Start time: ____________ Finish time: ____________ °  °This information is not intended to replace advice given to you by your health care provider. Make sure you discuss any questions you have with your health care provider. °  °Document Released: 07/27/2006 Document Revised: 07/18/2014 Document Reviewed: 04/23/2012 °Elsevier Interactive Patient Education ©2016 Elsevier Inc. ° °

## 2016-03-13 NOTE — ED Notes (Signed)
Pt denies any abd pain at present. LR infusing. No complaints at present.

## 2016-03-13 NOTE — Progress Notes (Signed)
Spoke with [facility] ED staff. Pt is up to bathroom and they are starting her IVF.

## 2016-03-13 NOTE — Progress Notes (Signed)
[facility] . Pt is to have a liter of IVF. Dr. Danne HarborHarraway-Smith's phone number given to him in case the Ed MD has any questions. [phone].

## 2016-03-13 NOTE — ED Triage Notes (Signed)
Patient is [redacted] weeks pregnant and having bilateral lower exteremity swelling. The patient reports that her left foot is worse and she is having pain up into her calf

## 2017-08-31 IMAGING — US US EXTREM LOW VENOUS*L*
1 series · 14 of 24 positions shown · non-contrast
Comparison: None.

CLINICAL DATA: Left lower extremity swelling and pain for 1 month.
Patient is 32 weeks pregnant.



[Series 1: us extrem low venous*left* · 0.06mm/px · 14 of 38 slices shown]
[im 1/38]
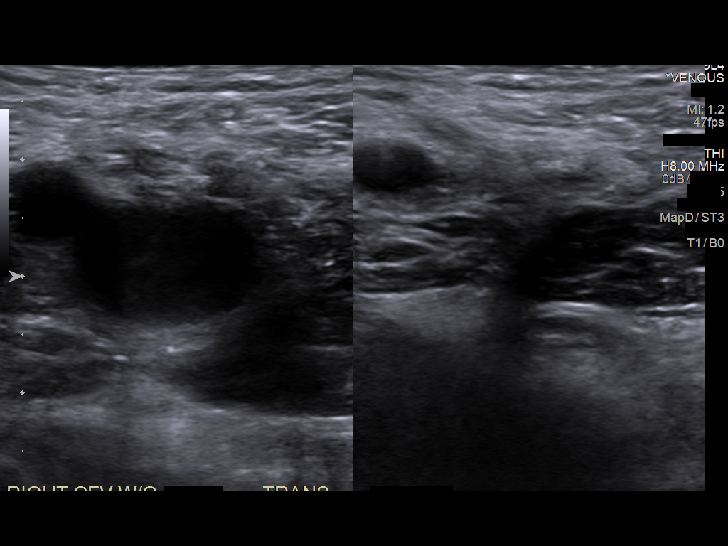
[im 4/38]
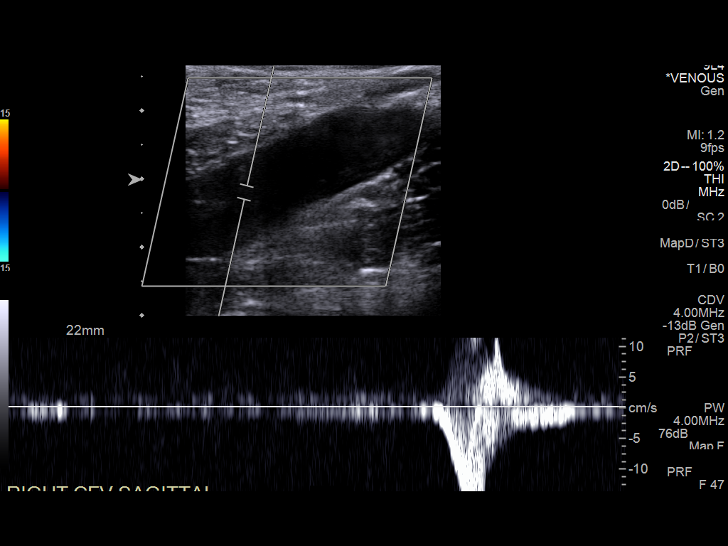
[im 7/38]
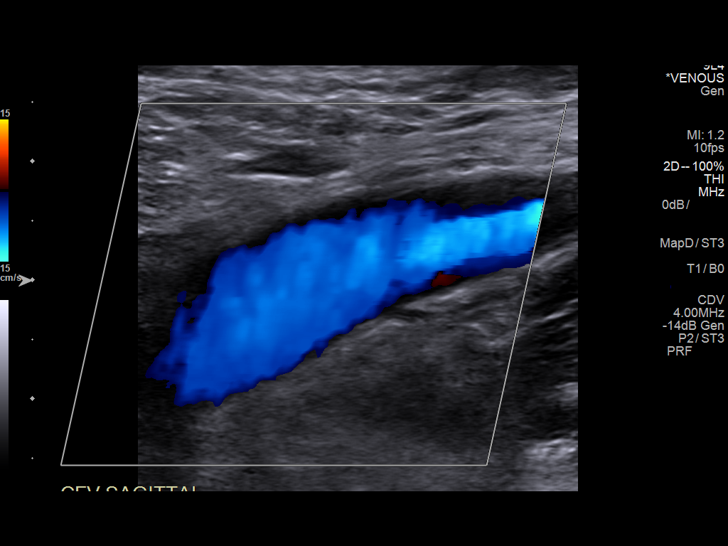
[im 10/38]
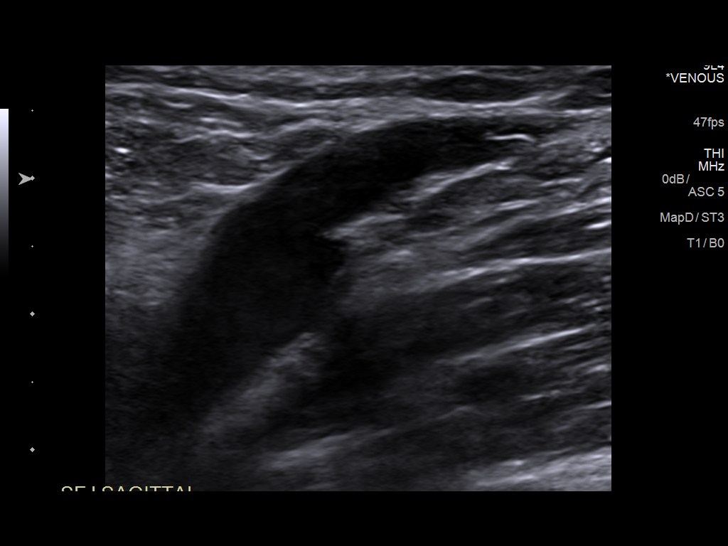
[im 12/38]
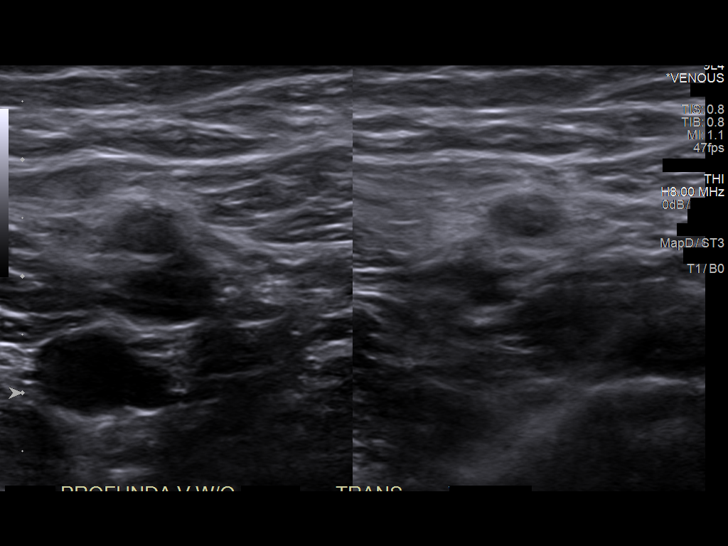
[im 15/38]
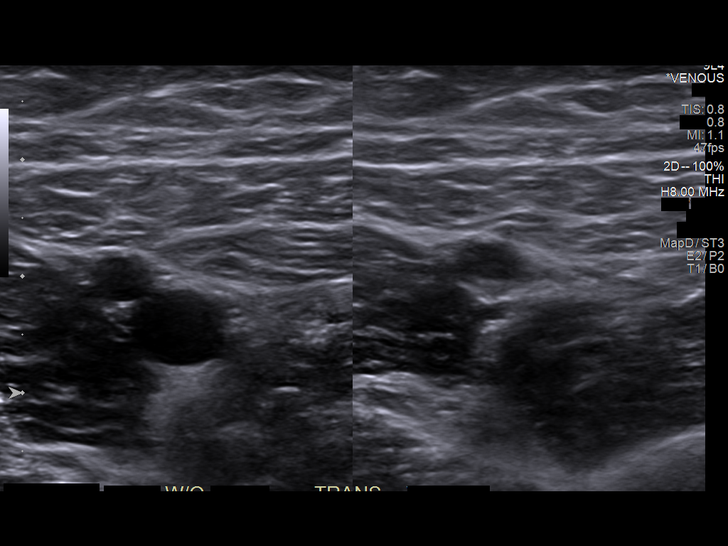
[im 18/38]
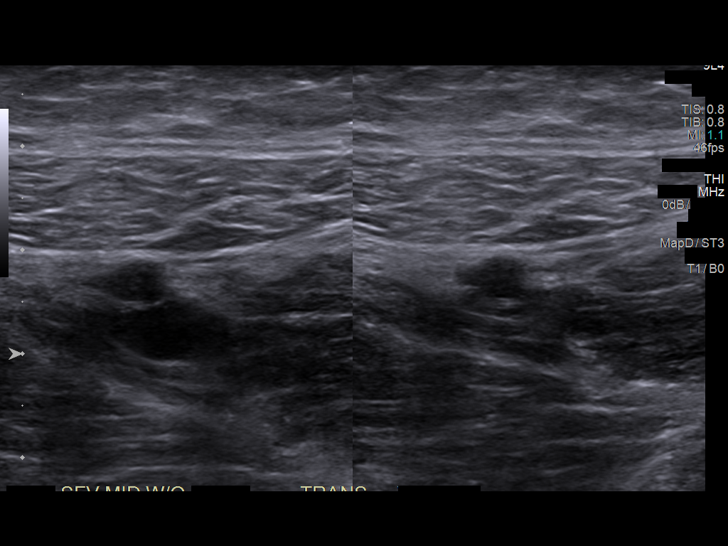
[im 20/38]
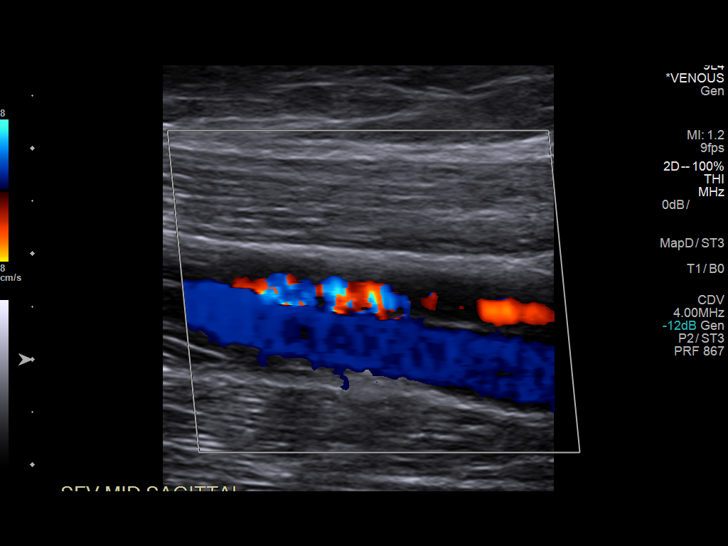
[im 23/38]
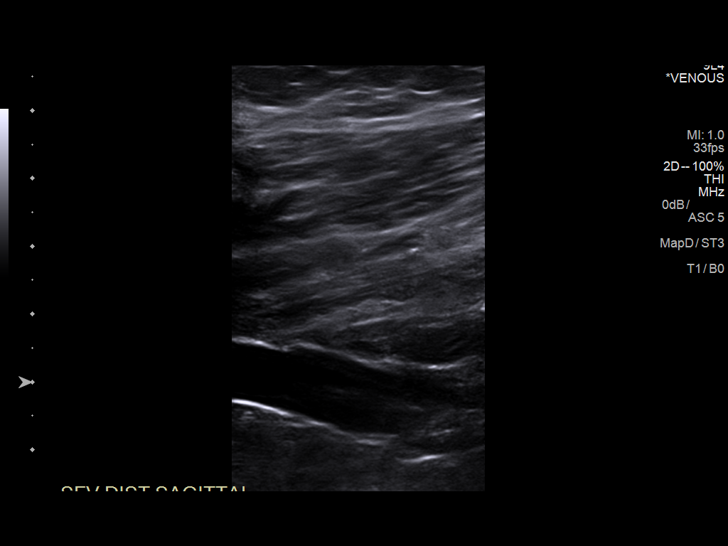
[im 26/38]
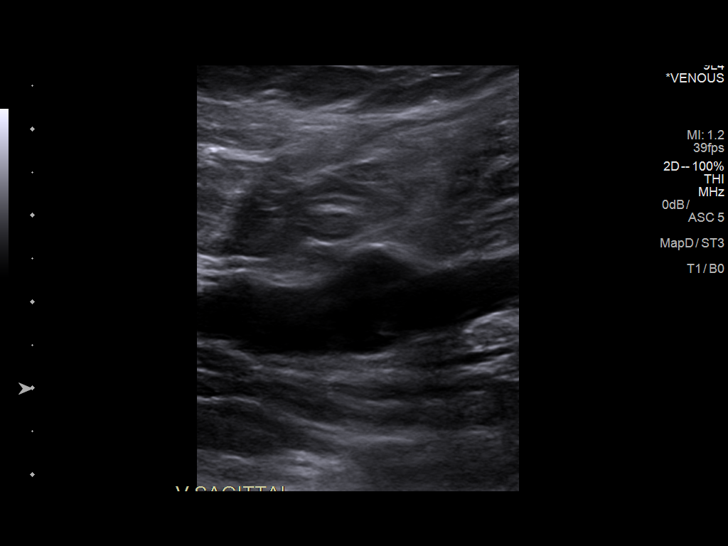
[im 29/38]
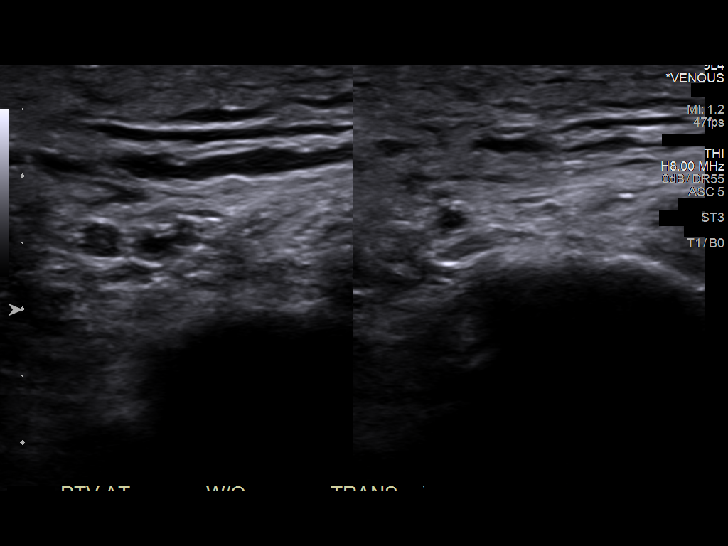
[im 31/38]
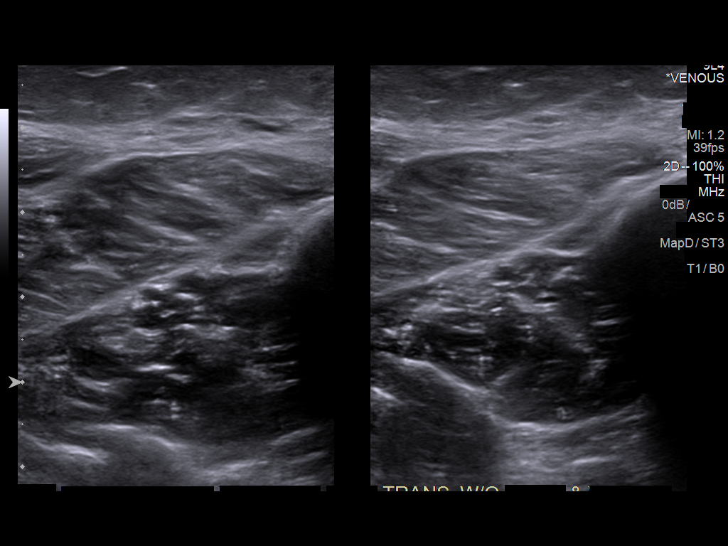
[im 34/38]
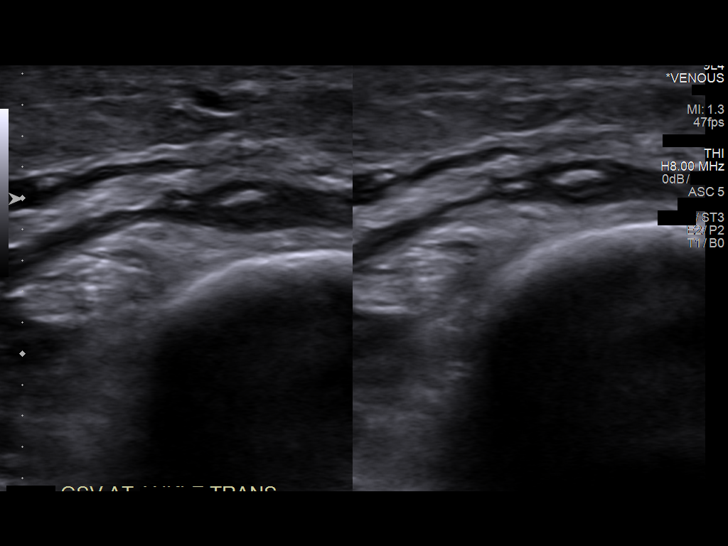
[im 38/38]
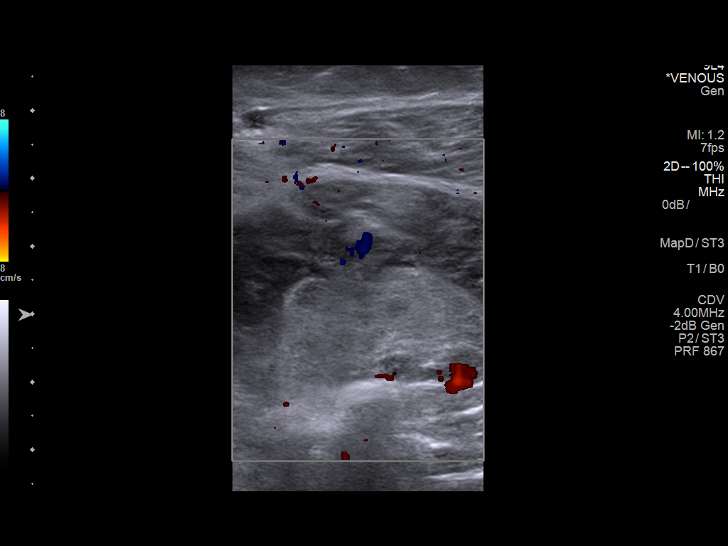

[14 of 24 positions shown; findings below may reference images not displayed]

FINDINGS: Normal flow, compressibility, and augmentation within the distal
common femoral, proximal profunda femoral, proximal greater
saphenous, entire femoral, popliteal veins, and imaged calf veins.
IMPRESSION: No evidence of left lower extremity deep venous thrombosis.

## 2019-12-28 ENCOUNTER — Emergency Department (HOSPITAL_BASED_OUTPATIENT_CLINIC_OR_DEPARTMENT_OTHER): Payer: HRSA Program

## 2019-12-28 ENCOUNTER — Other Ambulatory Visit: Payer: Self-pay

## 2019-12-28 ENCOUNTER — Emergency Department (HOSPITAL_BASED_OUTPATIENT_CLINIC_OR_DEPARTMENT_OTHER)
Admission: EM | Admit: 2019-12-28 | Discharge: 2019-12-28 | Disposition: A | Discharge status: Home / Self Care | Payer: HRSA Program | Attending: Emergency Medicine | Admitting: Emergency Medicine

## 2019-12-28 ENCOUNTER — Telehealth: Payer: Self-pay | Admitting: Infectious Diseases

## 2019-12-28 ENCOUNTER — Encounter (HOSPITAL_BASED_OUTPATIENT_CLINIC_OR_DEPARTMENT_OTHER): Payer: Self-pay | Admitting: Emergency Medicine

## 2019-12-28 DIAGNOSIS — U071 COVID-19: Secondary | ICD-10-CM | POA: Insufficient documentation

## 2019-12-28 DIAGNOSIS — E860 Dehydration: Secondary | ICD-10-CM | POA: Diagnosis not present

## 2019-12-28 DIAGNOSIS — Z87891 Personal history of nicotine dependence: Secondary | ICD-10-CM | POA: Insufficient documentation

## 2019-12-28 DIAGNOSIS — R000 Tachycardia, unspecified: Secondary | ICD-10-CM | POA: Diagnosis not present

## 2019-12-28 DIAGNOSIS — R109 Unspecified abdominal pain: Secondary | ICD-10-CM | POA: Diagnosis not present

## 2019-12-28 DIAGNOSIS — R509 Fever, unspecified: Secondary | ICD-10-CM | POA: Diagnosis present

## 2019-12-28 LAB — COMPREHENSIVE METABOLIC PANEL
ALT: 27 U/L (ref 0–44)
AST: 30 U/L (ref 15–41)
Albumin: 4.2 g/dL (ref 3.5–5.0)
Alkaline Phosphatase: 45 U/L (ref 38–126)
Anion gap: 14 (ref 5–15)
BUN: 9 mg/dL (ref 6–20)
CO2: 22 mmol/L (ref 22–32)
Calcium: 8.1 mg/dL — ABNORMAL LOW (ref 8.9–10.3)
Chloride: 97 mmol/L — ABNORMAL LOW (ref 98–111)
Creatinine, Ser: 0.93 mg/dL (ref 0.44–1.00)
GFR calc Af Amer: >60 mL/min (ref >60)
GFR calc non Af Amer: >60 mL/min (ref >60)
Glucose, Bld: 90 mg/dL (ref 70–99)
Potassium: 3.1 mmol/L — ABNORMAL LOW (ref 3.5–5.1)
Sodium: 133 mmol/L — ABNORMAL LOW (ref 135–145)
Total Bilirubin: 0.7 mg/dL (ref 0.3–1.2)
Total Protein: 7.7 g/dL (ref 6.5–8.1)

## 2019-12-28 LAB — CBC WITH DIFFERENTIAL/PLATELET
Abs Immature Granulocytes: 0.01 10*3/uL (ref 0.00–0.07)
Basophils Absolute: 0 10*3/uL (ref 0.0–0.1)
Basophils Relative: 0 %
Eosinophils Absolute: 0 10*3/uL (ref 0.0–0.5)
Eosinophils Relative: 0 %
HCT: 40.8 % (ref 36.0–46.0)
Hemoglobin: 12.5 g/dL (ref 12.0–15.0)
Immature Granulocytes: 0 %
Lymphocytes Relative: 35 %
Lymphs Abs: 1.5 10*3/uL (ref 0.7–4.0)
MCH: 24.1 pg — ABNORMAL LOW (ref 26.0–34.0)
MCHC: 30.6 g/dL (ref 30.0–36.0)
MCV: 78.6 fL — ABNORMAL LOW (ref 80.0–100.0)
Monocytes Absolute: 0.3 10*3/uL (ref 0.1–1.0)
Monocytes Relative: 8 %
Neutro Abs: 2.3 10*3/uL (ref 1.7–7.7)
Neutrophils Relative %: 57 %
Platelets: 251 10*3/uL (ref 150–400)
RBC: 5.19 MIL/uL — ABNORMAL HIGH (ref 3.87–5.11)
RDW: 13.5 % (ref 11.5–15.5)
WBC: 4.1 10*3/uL (ref 4.0–10.5)
nRBC: 0 % (ref 0.0–0.2)

## 2019-12-28 LAB — URINALYSIS, ROUTINE W REFLEX MICROSCOPIC
Bilirubin Urine: NEGATIVE
Glucose, UA: NEGATIVE mg/dL
Hgb urine dipstick: NEGATIVE
Ketones, ur: >80 mg/dL — AB
Leukocytes,Ua: NEGATIVE
Nitrite: NEGATIVE
Protein, ur: NEGATIVE mg/dL
Specific Gravity, Urine: <1.005 — ABNORMAL LOW (ref 1.005–1.030)
pH: 6 (ref 5.0–8.0)

## 2019-12-28 LAB — PREGNANCY, URINE: Preg Test, Ur: NEGATIVE

## 2019-12-28 LAB — SARS CORONAVIRUS 2 BY RT PCR (HOSPITAL ORDER, PERFORMED IN ~~LOC~~ HOSPITAL LAB): SARS Coronavirus 2: POSITIVE — AB

## 2019-12-28 MED ORDER — ONDANSETRON 4 MG PO TBDP: 4.0000 mg | ORAL_TABLET | Freq: Three times a day (TID) | ORAL | 0 refills | Status: AC | PRN

## 2019-12-28 MED ORDER — SODIUM CHLORIDE 0.9 % IV BOLUS
1000.0000 mL | Freq: Once | INTRAVENOUS | Status: AC
  Administered 2019-12-28: 1000 mL via INTRAVENOUS

## 2019-12-28 MED ORDER — BENZONATATE 100 MG PO CAPS
100.0000 mg | ORAL_CAPSULE | Freq: Three times a day (TID) | ORAL | 0 refills | Status: AC
Start: 2019-12-28 — End: ?

## 2019-12-28 MED ORDER — ACETAMINOPHEN 500 MG PO TABS
1000.0000 mg | ORAL_TABLET | Freq: Once | ORAL | Status: AC
  Administered 2019-12-28: 1000 mg via ORAL
  Filled 2019-12-28: qty 2

## 2019-12-28 MED ORDER — ONDANSETRON HCL 4 MG/2ML IJ SOLN
4.0000 mg | Freq: Once | INTRAMUSCULAR | Status: AC
  Administered 2019-12-28: 4 mg via INTRAVENOUS
  Filled 2019-12-28: qty 2

## 2019-12-28 MED ORDER — KETOROLAC TROMETHAMINE 30 MG/ML IJ SOLN
30.0000 mg | Freq: Once | INTRAMUSCULAR | Status: AC
  Administered 2019-12-28: 30 mg via INTRAVENOUS
  Filled 2019-12-28: qty 1

## 2019-12-28 MED ORDER — POTASSIUM CHLORIDE CRYS ER 20 MEQ PO TBCR
40.0000 meq | EXTENDED_RELEASE_TABLET | Freq: Once | ORAL | Status: AC
  Administered 2019-12-28: 40 meq via ORAL
  Filled 2019-12-28: qty 2

## 2019-12-28 MED ORDER — CYCLOBENZAPRINE HCL 10 MG PO TABS
10.0000 mg | ORAL_TABLET | Freq: Two times a day (BID) | ORAL | 0 refills | Status: AC | PRN
Start: 2019-12-28 — End: ?

## 2019-12-28 NOTE — ED Provider Notes (Signed)
[facility] EMERGENCY DEPARTMENT Provider Note   CSN: [id number] Arrival date & time: 12/28/19  0820     History Chief Complaint  Patient presents with  . Fever    Amber Thornton is a 34 y.o. female.  HPI      Started to have cough last Friday/Saturday Monday night began to have fevers, chills, body aches, nausea and vomiting Last night vomited approx 20 times No diarrhea, has not had BM (not regular typically) No urinary symptoms Burning of nostrils, no change in taste or smell No shortness of breath, pain from coughing severe coughing, sometimes coughing up a little yellow  No known sick contacts Went to [facility] for wedding  Past Medical History:  Diagnosis Date  . Medical history non-contributory   . No pertinent past medical history     Patient Active Problem List   Diagnosis Date Noted  . Anemia 05/29/2011    Past Surgical History:  Procedure Laterality Date  . NO PAST SURGERIES       OB History    Gravida  1   Para      Term      Preterm      AB      Living        SAB      TAB      Ectopic      Multiple      Live Births              Family History  Problem Relation Age of Onset  . Diabetes Father   . Stroke Father     Social History   Tobacco Use  . Smoking status: Former Smoker    Years: 0.00  . Smokeless tobacco: Never Used  Substance Use Topics  . Alcohol use: No  . Drug use: No    Home Medications Prior to Admission medications   Medication Sig Start Date End Date Taking? Authorizing Provider  benzonatate (TESSALON) 100 MG capsule Take 1 capsule (100 mg total) by mouth every 8 (eight) hours. 12/28/19   Alvira Monday, MD  cyclobenzaprine (FLEXERIL) 10 MG tablet Take 1 tablet (10 mg total) by mouth 2 (two) times daily as needed for muscle spasms. 12/28/19   Alvira Monday, MD  ondansetron (ZOFRAN ODT) 4 MG disintegrating tablet Take 1 tablet (4 mg total) by mouth every 8 (eight) hours as needed  for nausea or vomiting. 12/28/19   Alvira Monday, MD    Allergies    Codeine  Review of Systems   Review of Systems  Constitutional: Positive for appetite change, chills, fatigue and fever.  HENT: Positive for congestion, rhinorrhea and sore throat.   Eyes: Negative for visual disturbance.  Respiratory: Positive for cough. Negative for shortness of breath.   Cardiovascular: Negative for chest pain.  Gastrointestinal: Positive for abdominal pain, nausea and vomiting. Negative for diarrhea.  Genitourinary: Negative for difficulty urinating and dysuria.  Musculoskeletal: Positive for arthralgias and myalgias. Negative for back pain.  Skin: Negative for rash and wound.  Neurological: Positive for headaches.    Physical Exam Updated Vital Signs BP 105/82 (BP Location: Right Arm)   Pulse 95   Temp 98.8 F (37.1 C) (Oral)   Resp (!) 25   Ht 5\' 2"  (1.575 m)   Wt 72.6 kg   LMP 12/18/2019   SpO2 99%   Breastfeeding Unknown   BMI 29.26 kg/m   Physical Exam Vitals and nursing note reviewed.  Constitutional:  General: She is not in acute distress.    Appearance: She is well-developed. She is ill-appearing. She is not diaphoretic.  HENT:     Head: Normocephalic and atraumatic.  Eyes:     Conjunctiva/sclera: Conjunctivae normal.  Cardiovascular:     Rate and Rhythm: Regular rhythm. Tachycardia present.     Heart sounds: Normal heart sounds. No murmur heard.  No friction rub. No gallop.   Pulmonary:     Effort: Pulmonary effort is normal. No respiratory distress.     Breath sounds: Normal breath sounds. No wheezing or rales.  Abdominal:     General: There is no distension.     Palpations: Abdomen is soft.     Tenderness: There is no abdominal tenderness. There is no guarding.  Musculoskeletal:        General: No tenderness.     Cervical back: Normal range of motion.  Skin:    General: Skin is warm and dry.     Findings: No erythema or rash.  Neurological:      Mental Status: She is alert and oriented to person, place, and time.     ED Results / Procedures / Treatments   Labs (all labs ordered are listed, but only abnormal results are displayed) Labs Reviewed  SARS CORONAVIRUS 2 BY RT PCR (HOSPITAL ORDER, PERFORMED IN [place] [facility]) - Abnormal; Notable for the following components:      Result Value   SARS Coronavirus 2 POSITIVE (*)    All other components within normal limits  CBC WITH DIFFERENTIAL/PLATELET - Abnormal; Notable for the following components:   RBC 5.19 (*)    MCV 78.6 (*)    MCH 24.1 (*)    All other components within normal limits  COMPREHENSIVE METABOLIC PANEL - Abnormal; Notable for the following components:   Sodium 133 (*)    Potassium 3.1 (*)    Chloride 97 (*)    Calcium 8.1 (*)    All other components within normal limits  URINALYSIS, ROUTINE W REFLEX MICROSCOPIC - Abnormal; Notable for the following components:   Specific Gravity, Urine <1.005 (*)    Ketones, ur >80 (*)    All other components within normal limits  URINE CULTURE  PREGNANCY, URINE    EKG EKG Interpretation  Date/Time:  Saturday December 28 2019 09:07:19 EDT Ventricular Rate:  95 PR Interval:    QRS Duration: 79 QT Interval:  352 QTC Calculation: 443 R Axis:   86 Text Interpretation: Sinus rhythm Nonspecific changes since last ECG Confirmed by Alvira Monday ([id number]) on 12/28/2019 10:14:54 AM   Radiology DG Chest Portable 1 View  Result Date: 12/28/2019 CLINICAL DATA:  Cough.  Fever. EXAM: PORTABLE CHEST 1 VIEW COMPARISON:  October 12, 2010 FINDINGS: The heart size and mediastinal contours are within normal limits. Both lungs are clear. The visualized skeletal structures are unremarkable. IMPRESSION: No active disease. Electronically Signed   By: Gerome Sam III M.D   On: 12/28/2019 09:41    Procedures Procedures (including critical care time)  Medications Ordered in ED Medications  sodium chloride 0.9 % bolus 1,000 mL (0  mLs Intravenous Stopped 12/28/19 0958)  ondansetron (ZOFRAN) injection 4 mg (4 mg Intravenous Given 12/28/19 0850)  ketorolac (TORADOL) 30 MG/ML injection 30 mg (30 mg Intravenous Given 12/28/19 0851)  acetaminophen (TYLENOL) tablet 1,000 mg (1,000 mg Oral Given 12/28/19 0918)  potassium chloride SA (KLOR-CON) CR tablet 40 mEq (40 mEq Oral Given 12/28/19 1037)    ED Course  I have reviewed the triage vital signs and the nursing notes.  Pertinent labs & imaging results that were available during my care of the patient were reviewed by me and considered in my medical decision making (see chart for details).    MDM Rules/Calculators/A&P                           34 year old female presents with concern for fever, chills, nausea, vomiting, body aches and cough.  Chest x-ray is completed and shows no acute findings .  She is tachycardic and febrile on arrival to the emergency department.  Tachycardia likely secondary to dehydration and fever.  Ordered IV fluids, Tylenol, Toradol and Zofran.  Labs completed show no significant abnormalities. Given K for mild hypoklaemia  Urinalysis shows no sign of infection.  COVID 19 test positive. Recommend continued supportive care, 14 day quarantine. Called number for monoclonal ab.  Patient discharged in stable condition with understanding of reasons to return.     Amber Thornton was evaluated in Emergency Department on 12/28/2019 for the symptoms described in the history of present illness. She was evaluated in the context of the global COVID-19 pandemic, which necessitated consideration that the patient might be at risk for infection with the SARS-CoV-2 virus that causes COVID-19. Institutional protocols and algorithms that pertain to the evaluation of patients at risk for COVID-19 are in a state of rapid change based on information released by regulatory bodies including the CDC and federal and state organizations. These policies and algorithms were followed  during the patient's care in the ED.    Final Clinical Impression(s) / ED Diagnoses Final diagnoses:  COVID-19    Rx / DC Orders ED Discharge Orders         Ordered    ondansetron (ZOFRAN ODT) 4 MG disintegrating tablet  Every 8 hours PRN     Discontinue  Reprint     12/28/19 1032    cyclobenzaprine (FLEXERIL) 10 MG tablet  2 times daily PRN     Discontinue  Reprint     12/28/19 1032    benzonatate (TESSALON) 100 MG capsule  Every 8 hours     Discontinue  Reprint     12/28/19 1032           Gareth Morgan, MD 12/28/19 2207

## 2019-12-28 NOTE — ED Triage Notes (Signed)
Not feeling well for a week after returning from [facility]. C/o fever (100.5), vomiting, and cough.

## 2019-12-28 NOTE — Telephone Encounter (Signed)
Called to discuss with patient about Covid symptoms and the use of bamlanivimab, a monoclonal antibody infusion for those with mild to moderate Covid symptoms and at a high risk of hospitalization.  Pt is qualified for this infusion at the [facility] infusion center due to [employer].    She started with symptoms last Saturday 6/12 when she returned back from a trip. She mainly has GI disease r/t COVID with nausea, vomiting and abd pain. She does also on the phone describe some chest heaviness. She does not recall anyone being concerned about her breathing in the ER. CXR today was without any infiltrates and clear.   She would like to think about it and call back. Information given to reach [place].  Last day to get infusion is Monday 6/21. I also discussed that should her breathing worsen over the weekend she should return to the ER for evaluation and would need a different treatment algorithm for more severe disease.    Rexene Alberts, MSN, NP-C [facility] for Infectious Disease [facility]  [email or web address] Pager: [phone] Office: [phone] [facility] Main Line: [phone]

## 2019-12-29 ENCOUNTER — Other Ambulatory Visit: Payer: Self-pay | Admitting: Physician Assistant

## 2019-12-29 DIAGNOSIS — U071 COVID-19: Secondary | ICD-10-CM

## 2019-12-29 LAB — URINE CULTURE: Culture: <10000 — AB

## 2019-12-29 NOTE — Progress Notes (Signed)
  I connected by phone with Michael Boston on 12/29/2019 at 12:40 PM to discuss the potential use of an new treatment for mild to moderate COVID-19 viral infection in non-hospitalized patients.  This patient is a 34 y.o. female that meets the FDA criteria for Emergency Use Authorization of bamlanivimab/etesevimab or casirivimab/imdevimab.  Has a (+) direct SARS-CoV-2 viral test result  Has mild or moderate COVID-19   Is NOT hospitalized due to COVID-19  Is within 10 days of symptom onset  Has at least one of the high risk factor(s) for progression to severe COVID-19 and/or hospitalization as defined in EUA.  Specific high risk criteria : BMI > 25   I have spoken and communicated the following to the patient or parent/caregiver:  1. FDA has authorized the emergency use of bamlanivimab/etesevimab and casirivimab\imdevimab for the treatment of mild to moderate COVID-19 in adults and pediatric patients with positive results of direct SARS-CoV-2 viral testing who are 67 years of age and older weighing at least 40 kg, and who are at high risk for progressing to severe COVID-19 and/or hospitalization.  2. The significant known and potential risks and benefits of bamlanivimab/etesevimab and casirivimab\imdevimab, and the extent to which such potential risks and benefits are unknown.  3. Information on available alternative treatments and the risks and benefits of those alternatives, including clinical trials.  4. Patients treated with bamlanivimab/etesevimab and casirivimab\imdevimab should continue to self-isolate and use infection control measures (e.g., wear mask, isolate, social distance, avoid sharing personal items, clean and disinfect "high touch" surfaces, and frequent handwashing) according to CDC guidelines.   5. The patient or parent/caregiver has the option to accept or refuse bamlanivimab/etesevimab or casirivimab\imdevimab .  After reviewing this information with the patient, The  patient agreed to proceed with receiving the bamlanimivab infusion and will be provided a copy of the Fact sheet prior to receiving the infusion.Sharrell Ku Olivine Hiers 12/29/2019 12:40 PM

## 2019-12-30 ENCOUNTER — Ambulatory Visit (HOSPITAL_COMMUNITY)
Admission: RE | Admit: 2019-12-30 | Discharge: 2019-12-30 | Disposition: A | Discharge status: Home / Self Care | Payer: HRSA Program | Source: Ambulatory Visit | Attending: Pulmonary Disease | Admitting: Pulmonary Disease

## 2019-12-30 DIAGNOSIS — U071 COVID-19: Secondary | ICD-10-CM | POA: Insufficient documentation

## 2019-12-30 MED ORDER — SODIUM CHLORIDE 0.9 % IV SOLN: INTRAVENOUS | Status: DC | PRN

## 2019-12-30 MED ORDER — ALBUTEROL SULFATE HFA 108 (90 BASE) MCG/ACT IN AERS: 2.0000 | INHALATION_SPRAY | Freq: Once | RESPIRATORY_TRACT | Status: DC | PRN

## 2019-12-30 MED ORDER — EPINEPHRINE 0.3 MG/0.3ML IJ SOAJ: 0.3000 mg | Freq: Once | INTRAMUSCULAR | Status: DC | PRN

## 2019-12-30 MED ORDER — DIPHENHYDRAMINE HCL 50 MG/ML IJ SOLN: 50.0000 mg | Freq: Once | INTRAMUSCULAR | Status: DC | PRN

## 2019-12-30 MED ORDER — FAMOTIDINE IN NACL 20-0.9 MG/50ML-% IV SOLN: 20.0000 mg | Freq: Once | INTRAVENOUS | Status: DC | PRN

## 2019-12-30 MED ORDER — SODIUM CHLORIDE 0.9 % IV SOLN
Freq: Once | INTRAVENOUS | Status: AC
  Filled 2019-12-30: qty 700

## 2019-12-30 MED ORDER — METHYLPREDNISOLONE SODIUM SUCC 125 MG IJ SOLR: 125.0000 mg | Freq: Once | INTRAMUSCULAR | Status: DC | PRN

## 2019-12-30 NOTE — Progress Notes (Signed)
  Diagnosis: COVID-19  Physician:Dr wight   Procedure: Cov[facility] Infusion Clinic Med: bamlanivimab\etesevimab infusion - Provided patient with bamlanimivab\etesevimab fact sheet for patients, parents and caregivers prior to infusion.  Complications: No immediate complications noted.  Discharge: Discharged home   Amber Thornton 12/30/2019

## 2019-12-30 NOTE — Discharge Instructions (Signed)

## 2020-07-24 ENCOUNTER — Other Ambulatory Visit: Payer: Self-pay

## 2021-07-05 IMAGING — DX DG CHEST 1V PORT
1 series · 1 of 1 positions shown · non-contrast
Comparison: October 12, 2010

CLINICAL DATA: Cough.  Fever.

EXAM:
PORTABLE CHEST 1 VIEW

[chest ap]
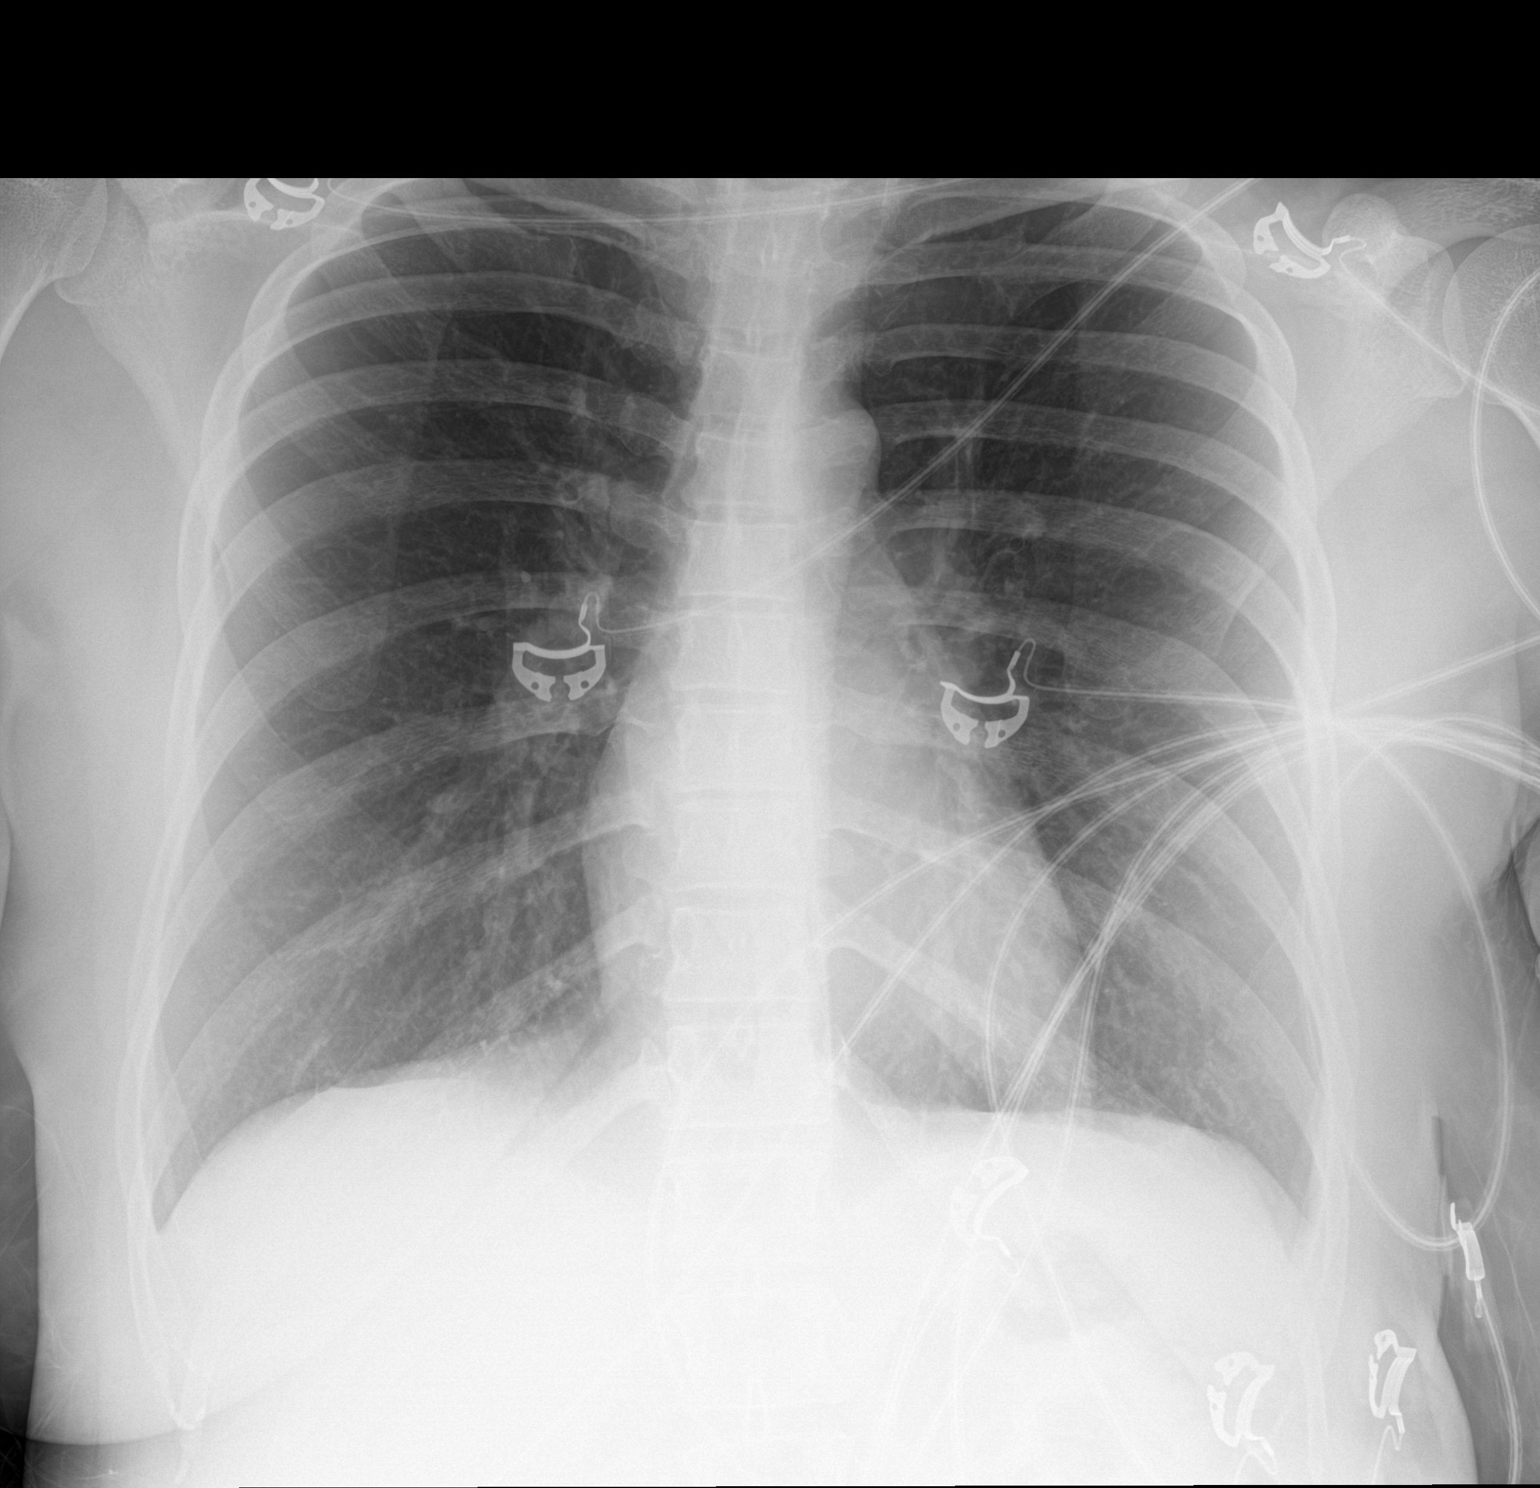

[1 of 1 positions shown; findings below may reference images not displayed]

FINDINGS: The heart size and mediastinal contours are within normal limits.
Both lungs are clear. The visualized skeletal structures are
unremarkable.
IMPRESSION: No active disease.
